# Patient Record
Sex: Female | Born: 1955 | Race: White | Hispanic: No | Marital: Married | Smoking: Never smoker
Health system: Southern US, Community
[De-identification: ages and names within clinical notes are randomized; demographics above are authoritative.]

## PROBLEM LIST (undated history)

## (undated) DIAGNOSIS — E785 Hyperlipidemia, unspecified: Secondary | ICD-10-CM

## (undated) HISTORY — DX: Hyperlipidemia, unspecified: E78.5

## (undated) HISTORY — PX: KNEE ARTHROSCOPY: SUR90

---

## 2004-05-28 ENCOUNTER — Encounter: Payer: Self-pay | Admitting: Family Medicine

## 2004-05-28 LAB — CONVERTED CEMR LAB

## 2004-07-27 ENCOUNTER — Ambulatory Visit: Payer: Self-pay

## 2004-08-17 ENCOUNTER — Ambulatory Visit: Payer: Self-pay | Admitting: Family Medicine

## 2005-05-28 LAB — CONVERTED CEMR LAB: Pap Smear: NORMAL

## 2005-12-05 ENCOUNTER — Ambulatory Visit: Payer: Self-pay | Admitting: Family Medicine

## 2007-01-09 ENCOUNTER — Telehealth (INDEPENDENT_AMBULATORY_CARE_PROVIDER_SITE_OTHER): Payer: Self-pay | Admitting: *Deleted

## 2007-03-22 ENCOUNTER — Encounter: Payer: Self-pay | Admitting: Family Medicine

## 2007-03-22 DIAGNOSIS — E785 Hyperlipidemia, unspecified: Secondary | ICD-10-CM

## 2007-03-29 ENCOUNTER — Encounter: Payer: Self-pay | Admitting: Family Medicine

## 2007-08-01 ENCOUNTER — Telehealth (INDEPENDENT_AMBULATORY_CARE_PROVIDER_SITE_OTHER): Payer: Self-pay | Admitting: *Deleted

## 2007-10-17 ENCOUNTER — Encounter: Payer: Self-pay | Admitting: Family Medicine

## 2007-10-22 ENCOUNTER — Ambulatory Visit: Payer: Self-pay | Admitting: Family Medicine

## 2007-11-22 ENCOUNTER — Encounter: Payer: Self-pay | Admitting: Family Medicine

## 2007-12-19 ENCOUNTER — Encounter: Payer: Self-pay | Admitting: Family Medicine

## 2007-12-24 ENCOUNTER — Encounter: Payer: Self-pay | Admitting: Family Medicine

## 2007-12-25 ENCOUNTER — Encounter (INDEPENDENT_AMBULATORY_CARE_PROVIDER_SITE_OTHER): Payer: Self-pay | Admitting: *Deleted

## 2008-08-28 HISTORY — PX: NOVASURE ABLATION: SHX5394

## 2008-10-06 ENCOUNTER — Ambulatory Visit: Payer: Self-pay | Admitting: Family Medicine

## 2008-10-06 LAB — CONVERTED CEMR LAB
Bilirubin Urine: NEGATIVE
Ketones, urine, test strip: NEGATIVE
RBC / HPF: 0
Specific Gravity, Urine: <1.005
Urobilinogen, UA: 0.2
pH: 7

## 2008-10-07 ENCOUNTER — Encounter (INDEPENDENT_AMBULATORY_CARE_PROVIDER_SITE_OTHER): Payer: Self-pay | Admitting: Internal Medicine

## 2008-11-14 ENCOUNTER — Encounter: Payer: Self-pay | Admitting: Family Medicine

## 2008-11-16 ENCOUNTER — Other Ambulatory Visit: Admission: RE | Admit: 2008-11-16 | Discharge: 2008-11-16 | Payer: Self-pay | Admitting: Family Medicine

## 2008-11-16 ENCOUNTER — Encounter: Payer: Self-pay | Admitting: Family Medicine

## 2008-11-16 ENCOUNTER — Ambulatory Visit: Payer: Self-pay | Admitting: Family Medicine

## 2008-11-19 ENCOUNTER — Encounter (INDEPENDENT_AMBULATORY_CARE_PROVIDER_SITE_OTHER): Payer: Self-pay | Admitting: *Deleted

## 2008-12-21 ENCOUNTER — Encounter: Payer: Self-pay | Admitting: Family Medicine

## 2008-12-28 ENCOUNTER — Encounter (INDEPENDENT_AMBULATORY_CARE_PROVIDER_SITE_OTHER): Payer: Self-pay | Admitting: *Deleted

## 2009-01-20 ENCOUNTER — Telehealth: Payer: Self-pay | Admitting: Family Medicine

## 2009-02-04 ENCOUNTER — Encounter: Payer: Self-pay | Admitting: Family Medicine

## 2009-02-10 ENCOUNTER — Telehealth: Payer: Self-pay | Admitting: Family Medicine

## 2009-02-23 ENCOUNTER — Encounter: Payer: Self-pay | Admitting: Family Medicine

## 2009-04-23 ENCOUNTER — Encounter: Payer: Self-pay | Admitting: Family Medicine

## 2009-12-24 LAB — BASIC METABOLIC PANEL: BUN: 15 mg/dL (ref 4–21)

## 2009-12-24 LAB — CBC AND DIFFERENTIAL
HCT: 36 % (ref 36–46)
Hemoglobin: 12.3 g/dL (ref 12.0–16.0)

## 2010-06-13 ENCOUNTER — Ambulatory Visit: Payer: Self-pay | Admitting: Unknown Physician Specialty

## 2010-06-14 LAB — PATHOLOGY REPORT

## 2010-10-21 LAB — BASIC METABOLIC PANEL
BUN: 14 mg/dL (ref 4–21)
Glucose: 90 mg/dL

## 2011-10-13 ENCOUNTER — Telehealth: Payer: Self-pay | Admitting: Internal Medicine

## 2011-10-13 NOTE — Telephone Encounter (Signed)
Pls advise.  

## 2011-10-13 NOTE — Telephone Encounter (Signed)
Pt aware.

## 2011-10-13 NOTE — Telephone Encounter (Signed)
She can pick up orders for labwork and for mammogram at our office prior to appointment.

## 2011-10-18 ENCOUNTER — Telehealth: Payer: Self-pay | Admitting: *Deleted

## 2011-10-18 DIAGNOSIS — Z1231 Encounter for screening mammogram for malignant neoplasm of breast: Secondary | ICD-10-CM

## 2011-10-18 DIAGNOSIS — E785 Hyperlipidemia, unspecified: Secondary | ICD-10-CM

## 2011-10-18 DIAGNOSIS — Z79899 Other long term (current) drug therapy: Secondary | ICD-10-CM

## 2011-10-18 NOTE — Telephone Encounter (Signed)
Labs ordered, Written order completed for pt to pick up, pt is aware

## 2011-10-18 NOTE — Telephone Encounter (Signed)
See last phone note, What labs? Please order so pt can go to [facility] then we will print the order for pt to pick up.

## 2011-10-25 ENCOUNTER — Telehealth: Payer: Self-pay | Admitting: Internal Medicine

## 2011-10-26 ENCOUNTER — Encounter: Payer: Self-pay | Admitting: Internal Medicine

## 2011-10-26 ENCOUNTER — Ambulatory Visit (INDEPENDENT_AMBULATORY_CARE_PROVIDER_SITE_OTHER): Payer: 59 | Admitting: Internal Medicine

## 2011-10-26 DIAGNOSIS — E785 Hyperlipidemia, unspecified: Secondary | ICD-10-CM

## 2011-10-26 DIAGNOSIS — R739 Hyperglycemia, unspecified: Secondary | ICD-10-CM

## 2011-10-26 DIAGNOSIS — Z803 Family history of malignant neoplasm of breast: Secondary | ICD-10-CM | POA: Insufficient documentation

## 2011-10-26 DIAGNOSIS — Z0000 Encounter for general adult medical examination without abnormal findings: Secondary | ICD-10-CM | POA: Insufficient documentation

## 2011-10-26 DIAGNOSIS — R7309 Other abnormal glucose: Secondary | ICD-10-CM

## 2011-10-26 DIAGNOSIS — Z23 Encounter for immunization: Secondary | ICD-10-CM

## 2011-10-26 MED ORDER — ZOSTER VACCINE LIVE 19400 UNT/0.65ML ~~LOC~~ SOLR: 0.6500 mL | Freq: Once | SUBCUTANEOUS | Status: AC

## 2011-10-26 MED ORDER — ZOSTER VACCINE LIVE 19400 UNT/0.65ML ~~LOC~~ SOLR: 0.6500 mL | Freq: Once | SUBCUTANEOUS | Status: DC

## 2011-10-26 MED ORDER — SIMVASTATIN 20 MG PO TABS: 20.0000 mg | ORAL_TABLET | Freq: Every evening | ORAL | Status: DC

## 2011-10-26 NOTE — Assessment & Plan Note (Signed)
Noted to have hemoglobin A1c of 6% on recent screen by her employer. Recent fasting blood sugar was normal. Will plan to repeat hemoglobin A1c with next labs. Encouraged healthy diet including reduction in processed carbohydrates and increased intake of lean protein and fiber.

## 2011-10-26 NOTE — Assessment & Plan Note (Signed)
Patient's mother was recently diagnosed with breast cancer. Patient also has several gradients who had breast cancer. She is interested in talking to a Dentist about testing for [facility] mutations. We'll set this up for her. Her recent mammogram was normal.  Breast exam is normal today.

## 2011-10-26 NOTE — Progress Notes (Signed)
Subjective:    Patient ID: Sylvia [name], female    DOB: 06-Aug-1956, 56 y.o.   MRN: [id number]  HPI 56 year old female presents for annual exam. She reports that she is generally doing well. She brings record of recent screening from her employer which showed her hemoglobin A1c to be elevated at 6%. She has no history of diabetes. She has no family history of diabetes. She reports that she generally follows a healthy diet and exercises regularly.  We also reviewed labs today from our office showing normal cholesterol levels, kidney and liver function, and blood counts.  Patient notes that over the last year her mother was diagnosed with breast cancer. Her mother has also reported to her that she has several great aunts who have breast cancer. The patient is interested in having testing for her genetic risk for breast cancer. She notes that her recent mammogram performed last month was normal.  Outpatient Encounter Prescriptions as of 10/26/2011  Medication Sig Dispense Refill  . cholecalciferol (VITAMIN D) 1000 UNITS tablet Take 1,000 Units by mouth daily.      . fish oil-omega-3 fatty acids 1000 MG capsule Take 2 g by mouth daily.      . Multiple Vitamins-Minerals (MULTIVITAMIN WITH MINERALS) tablet Take 1 tablet by mouth daily.      . simvastatin (ZOCOR) 20 MG tablet Take 1 tablet (20 mg total) by mouth every evening.  90 tablet  4    Review of Systems  Constitutional: Negative for fever, chills, appetite change, fatigue and unexpected weight change.  HENT: Negative for ear pain, congestion, sore throat, trouble swallowing, neck pain, voice change and sinus pressure.   Eyes: Negative for visual disturbance.  Respiratory: Negative for cough, shortness of breath, wheezing and stridor.   Cardiovascular: Negative for chest pain, palpitations and leg swelling.  Gastrointestinal: Negative for nausea, vomiting, abdominal pain, diarrhea, constipation, blood in stool, abdominal distention and anal  bleeding.  Genitourinary: Negative for dysuria and flank pain.  Musculoskeletal: Negative for myalgias, arthralgias and gait problem.  Skin: Negative for color change and rash.  Neurological: Negative for dizziness and headaches.  Hematological: Negative for adenopathy. Does not bruise/bleed easily.  Psychiatric/Behavioral: Negative for suicidal ideas, sleep disturbance and dysphoric mood. The patient is not nervous/anxious.    BP 98/60  Pulse 91  Temp(Src) 98 F (36.7 C) (Oral)  Ht 5' 3.5" (1.613 m)  Wt 145 lb (65.772 kg)  BMI 25.28 kg/m2  SpO2 100%     Objective:   Physical Exam  Constitutional: She is oriented to person, place, and time. She appears well-developed and well-nourished. No distress.  HENT:  Head: Normocephalic and atraumatic.  Right Ear: External ear normal.  Left Ear: External ear normal.  Nose: Nose normal.  Mouth/Throat: Oropharynx is clear and moist. No oropharyngeal exudate.  Eyes: Conjunctivae are normal. Pupils are equal, round, and reactive to light. Right eye exhibits no discharge. Left eye exhibits no discharge. No scleral icterus.  Neck: Normal range of motion. Neck supple. No tracheal deviation present. No thyromegaly present.  Cardiovascular: Normal rate, regular rhythm, normal heart sounds and intact distal pulses.  Exam reveals no gallop and no friction rub.   No murmur heard. Pulmonary/Chest: Effort normal and breath sounds normal. No respiratory distress. She has no wheezes. She has no rales. She exhibits no tenderness. Right breast exhibits no inverted nipple, no mass, no nipple discharge, no skin change and no tenderness. Left breast exhibits no inverted nipple, no mass, no nipple discharge,  no skin change and no tenderness. Breasts are symmetrical.  Abdominal: Soft. Bowel sounds are normal. She exhibits no distension and no mass. There is no tenderness. There is no rebound and no guarding.  Musculoskeletal: Normal range of motion. She exhibits no  edema and no tenderness.  Lymphadenopathy:    She has no cervical adenopathy.  Neurological: She is alert and oriented to person, place, and time. No cranial nerve deficit. She exhibits normal muscle tone. Coordination normal.  Skin: Skin is warm and dry. No rash noted. She is not diaphoretic. No erythema. No pallor.  Psychiatric: She has a normal mood and affect. Her behavior is normal. Judgment and thought content normal.          Assessment & Plan:

## 2011-10-26 NOTE — Assessment & Plan Note (Signed)
Exam including breast exam is normal today. Patient is up-to-date on Pap smear. Plan for repeat Pap in 2015. Patient is up-to-date on health maintenance including mammogram. Vaccinations are up to date with the exception of Zostavax and this was ordered today. Labs including CBC, CMP, lipid profile, and hemoglobin A1c were reviewed today. Followup in 6 months.

## 2011-10-26 NOTE — Assessment & Plan Note (Signed)
Lipid profile reviewed today. Lipids are well controlled. Plan to repeat lipid profile in 6 months.

## 2011-10-26 NOTE — Telephone Encounter (Signed)
Pt has OV today

## 2011-10-30 ENCOUNTER — Encounter: Payer: Self-pay | Admitting: Internal Medicine

## 2011-11-06 ENCOUNTER — Encounter: Payer: Self-pay | Admitting: Internal Medicine

## 2011-11-08 ENCOUNTER — Ambulatory Visit (INDEPENDENT_AMBULATORY_CARE_PROVIDER_SITE_OTHER): Payer: 59 | Admitting: Internal Medicine

## 2011-11-08 ENCOUNTER — Encounter: Payer: Self-pay | Admitting: Internal Medicine

## 2011-11-08 VITALS — BP 118/60 | HR 87 | Temp 98.3°F | Wt 149.0 lb

## 2011-11-08 DIAGNOSIS — J40 Bronchitis, not specified as acute or chronic: Secondary | ICD-10-CM

## 2011-11-08 MED ORDER — LEVOFLOXACIN 750 MG PO TABS: 750.0000 mg | ORAL_TABLET | Freq: Every day | ORAL | Status: AC

## 2011-11-08 NOTE — Progress Notes (Signed)
  Subjective:    Patient ID: Sylvia [name], female    DOB: Jun 02, 1956, 56 y.o.   MRN: [id number]  HPI 56YO female presents for acute visit c/o 3 week h/o sore throat, cough productive of purulent sputum.  Denies dyspnea or chest pain.  Has been taking mucinex with no improvement in symptoms.  Initially had fever, chills, but this has resolved.  Outpatient Encounter Prescriptions as of 11/08/2011  Medication Sig Dispense Refill  . cholecalciferol (VITAMIN D) 1000 UNITS tablet Take 1,000 Units by mouth daily.      . fish oil-omega-3 fatty acids 1000 MG capsule Take 2 g by mouth daily.      . Multiple Vitamins-Minerals (MULTIVITAMIN WITH MINERALS) tablet Take 1 tablet by mouth daily.      . simvastatin (ZOCOR) 20 MG tablet Take 1 tablet (20 mg total) by mouth every evening.  90 tablet  4  . levofloxacin (LEVAQUIN) 750 MG tablet Take 1 tablet (750 mg total) by mouth daily.  7 tablet  0    Review of Systems  Constitutional: Positive for fever, chills and fatigue. Negative for unexpected weight change.  HENT: Positive for congestion and postnasal drip. Negative for hearing loss, ear pain, nosebleeds, sore throat, facial swelling, rhinorrhea, sneezing, mouth sores, trouble swallowing, neck pain, neck stiffness, voice change, sinus pressure, tinnitus and ear discharge.   Eyes: Negative for pain, discharge, redness and visual disturbance.  Respiratory: Positive for cough. Negative for chest tightness, shortness of breath, wheezing and stridor.   Cardiovascular: Negative for chest pain, palpitations and leg swelling.  Musculoskeletal: Negative for myalgias and arthralgias.  Skin: Negative for color change and rash.  Neurological: Negative for dizziness, weakness, light-headedness and headaches.  Hematological: Negative for adenopathy.   BP 118/60  Pulse 87  Temp(Src) 98.3 F (36.8 C) (Oral)  Wt 149 lb (67.586 kg)  SpO2 100%     Objective:   Physical Exam  Constitutional: She is oriented to  person, place, and time. She appears well-developed and well-nourished. No distress.  HENT:  Head: Normocephalic and atraumatic.  Right Ear: External ear normal.  Left Ear: External ear normal.  Nose: Nose normal.  Mouth/Throat: Oropharynx is clear and moist. No oropharyngeal exudate.  Eyes: Conjunctivae are normal. Pupils are equal, round, and reactive to light. Right eye exhibits no discharge. Left eye exhibits no discharge. No scleral icterus.  Neck: Normal range of motion. Neck supple. No tracheal deviation present. No thyromegaly present.  Cardiovascular: Normal rate, regular rhythm, normal heart sounds and intact distal pulses.  Exam reveals no gallop and no friction rub.   No murmur heard. Pulmonary/Chest: Effort normal. No accessory muscle usage. Not tachypneic. No respiratory distress. She has no wheezes. She has rhonchi (diffuse). She has no rales. She exhibits no tenderness.  Musculoskeletal: Normal range of motion. She exhibits no edema and no tenderness.  Lymphadenopathy:    She has no cervical adenopathy.  Neurological: She is alert and oriented to person, place, and time. No cranial nerve deficit. She exhibits normal muscle tone. Coordination normal.  Skin: Skin is warm and dry. No rash noted. She is not diaphoretic. No erythema. No pallor.  Psychiatric: She has a normal mood and affect. Her behavior is normal. Judgment and thought content normal.          Assessment & Plan:

## 2011-11-08 NOTE — Assessment & Plan Note (Signed)
Symptoms and exam consistent with bronchitis. Will treat with Levaquin. No wheezing or prolonged expiration on exam to suggest need for steroids or bronchodilator. Pt will call if symptoms not improving over next 48hr.

## 2012-04-18 ENCOUNTER — Telehealth: Payer: Self-pay | Admitting: Internal Medicine

## 2012-04-18 NOTE — Telephone Encounter (Signed)
Caller: Yomira/Patient; Patient Name: Sylvia [name]; PCP: Ronna Polio; Best Callback Phone Number: [phone]. Patient has left knee pain. She was calling to ask if the office has x-ray capabilities. Informed patient that we do not do x-rays in the office but prefer that she be seen before being referred to a clinic. Patient prefers to be seen in an urgent care where they can do x-rays at that time. Offered to triage the patient for need to be seen in office, but she refused at this time.

## 2012-05-14 ENCOUNTER — Ambulatory Visit: Payer: Self-pay | Admitting: Unknown Physician Specialty

## 2012-06-14 ENCOUNTER — Ambulatory Visit: Payer: Self-pay | Admitting: Unknown Physician Specialty

## 2012-09-12 ENCOUNTER — Telehealth: Payer: Self-pay | Admitting: Internal Medicine

## 2012-09-12 NOTE — Telephone Encounter (Signed)
Left message on machine at home advising patient that [facility] order form is ready for pick up will be left at front desk.

## 2012-09-12 NOTE — Telephone Encounter (Signed)
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

## 2012-09-12 NOTE — Telephone Encounter (Signed)
We can fill out sheet for her to pick up. On your desk.

## 2012-11-15 ENCOUNTER — Encounter: Payer: 59 | Admitting: Internal Medicine

## 2012-12-13 ENCOUNTER — Other Ambulatory Visit: Payer: Self-pay | Admitting: Internal Medicine

## 2012-12-14 LAB — HGB A1C W/O EAG: Hgb A1c MFr Bld: 5.6 % (ref 4.8–5.6)

## 2012-12-14 LAB — COMPREHENSIVE METABOLIC PANEL
ALT: 12 IU/L (ref 0–32)
AST: 15 IU/L (ref 0–40)
Albumin/Globulin Ratio: 2.1 (ref 1.1–2.5)
Alkaline Phosphatase: 82 IU/L (ref 39–117)
BUN/Creatinine Ratio: 19 (ref 9–23)
Calcium: 9.3 mg/dL (ref 8.7–10.2)
Creatinine, Ser: 0.74 mg/dL (ref 0.57–1.00)
GFR calc non Af Amer: 91 mL/min/{1.73_m2} (ref >59)
Globulin, Total: 2.1 g/dL (ref 1.5–4.5)
Potassium: 4.2 mmol/L (ref 3.5–5.2)
Sodium: 142 mmol/L (ref 134–144)

## 2012-12-14 LAB — CBC WITH DIFFERENTIAL
Basos: 1 % (ref 0–3)
Eosinophils Absolute: 0.1 10*3/uL (ref 0.0–0.4)
Immature Grans (Abs): 0 10*3/uL (ref 0.0–0.1)
Immature Granulocytes: 0 % (ref 0–2)
Lymphocytes Absolute: 1.6 10*3/uL (ref 0.7–3.1)
MCH: 30.3 pg (ref 26.6–33.0)
MCHC: 33.3 g/dL (ref 31.5–35.7)
MCV: 91 fL (ref 79–97)
Monocytes Absolute: 0.3 10*3/uL (ref 0.1–0.9)
Neutrophils Relative %: 50 % (ref 40–74)
Platelets: 231 10*3/uL (ref 155–379)

## 2012-12-14 LAB — LIPID PANEL W/O CHOL/HDL RATIO: HDL: 68 mg/dL (ref >39)

## 2012-12-14 LAB — TSH: TSH: 1.64 u[IU]/mL (ref 0.450–4.500)

## 2012-12-14 LAB — VITAMIN D 25 HYDROXY (VIT D DEFICIENCY, FRACTURES): Vit D, 25-Hydroxy: 30.1 ng/mL (ref 30.0–100.0)

## 2012-12-25 ENCOUNTER — Encounter: Payer: Self-pay | Admitting: Internal Medicine

## 2012-12-25 ENCOUNTER — Other Ambulatory Visit (HOSPITAL_COMMUNITY)
Admission: RE | Admit: 2012-12-25 | Discharge: 2012-12-25 | Disposition: A | Discharge status: Home / Self Care | Payer: 59 | Source: Ambulatory Visit | Attending: Internal Medicine | Admitting: Internal Medicine

## 2012-12-25 ENCOUNTER — Ambulatory Visit (INDEPENDENT_AMBULATORY_CARE_PROVIDER_SITE_OTHER): Payer: 59 | Admitting: Internal Medicine

## 2012-12-25 VITALS — BP 124/72 | HR 84 | Temp 98.2°F | Ht 63.5 in | Wt 143.0 lb

## 2012-12-25 DIAGNOSIS — Z0000 Encounter for general adult medical examination without abnormal findings: Secondary | ICD-10-CM

## 2012-12-25 DIAGNOSIS — E785 Hyperlipidemia, unspecified: Secondary | ICD-10-CM

## 2012-12-25 DIAGNOSIS — Z01419 Encounter for gynecological examination (general) (routine) without abnormal findings: Secondary | ICD-10-CM | POA: Insufficient documentation

## 2012-12-25 DIAGNOSIS — Z1151 Encounter for screening for human papillomavirus (HPV): Secondary | ICD-10-CM | POA: Insufficient documentation

## 2012-12-25 LAB — HM PAP SMEAR: HM Pap smear: NEGATIVE

## 2012-12-25 MED ORDER — SIMVASTATIN 20 MG PO TABS: 20.0000 mg | ORAL_TABLET | Freq: Every evening | ORAL | Status: DC

## 2012-12-25 MED ORDER — ZOSTER VACCINE LIVE 19400 UNT/0.65ML ~~LOC~~ SOLR: 0.6500 mL | Freq: Once | SUBCUTANEOUS | Status: DC

## 2012-12-25 NOTE — Progress Notes (Signed)
Subjective:    Patient ID: Sylvia [name], female    DOB: 02-26-1956, 58 y.o.   MRN: [id number]  HPI 57YO female presents for annual exam. Doing well. No concerns today. Follows healthy diet and gets regular physical activity. Compliant with simvastatin. No noted side effects.  Outpatient Encounter Prescriptions as of 12/25/2012  Medication Sig Dispense Refill  . cholecalciferol (VITAMIN D) 1000 UNITS tablet Take 1,000 Units by mouth daily.      . fish oil-omega-3 fatty acids 1000 MG capsule Take 2 g by mouth daily.      . simvastatin (ZOCOR) 20 MG tablet Take 1 tablet (20 mg total) by mouth every evening.  90 tablet  4   No facility-administered encounter medications on file as of 12/25/2012.   BP 124/72  Pulse 84  Temp(Src) 98.2 F (36.8 C) (Oral)  Ht 5' 3.5" (1.613 m)  Wt 143 lb (64.864 kg)  BMI 24.93 kg/m2  SpO2 98%  Review of Systems  Constitutional: Negative for fever, chills, appetite change, fatigue and unexpected weight change.  HENT: Negative for ear pain, congestion, sore throat, trouble swallowing, neck pain, voice change and sinus pressure.   Eyes: Negative for visual disturbance.  Respiratory: Negative for cough, shortness of breath, wheezing and stridor.   Cardiovascular: Negative for chest pain, palpitations and leg swelling.  Gastrointestinal: Negative for nausea, vomiting, abdominal pain, diarrhea, constipation, blood in stool, abdominal distention and anal bleeding.  Genitourinary: Negative for dysuria and flank pain.  Musculoskeletal: Negative for myalgias, arthralgias and gait problem.  Skin: Negative for color change and rash.  Neurological: Negative for dizziness and headaches.  Hematological: Negative for adenopathy. Does not bruise/bleed easily.  Psychiatric/Behavioral: Negative for suicidal ideas, sleep disturbance and dysphoric mood. The patient is not nervous/anxious.        Objective:   Physical Exam  Constitutional: She is oriented to person,  place, and time. She appears well-developed and well-nourished. No distress.  HENT:  Head: Normocephalic and atraumatic.  Right Ear: External ear normal.  Left Ear: External ear normal.  Nose: Nose normal.  Mouth/Throat: Oropharynx is clear and moist. No oropharyngeal exudate.  Eyes: Conjunctivae are normal. Pupils are equal, round, and reactive to light. Right eye exhibits no discharge. Left eye exhibits no discharge. No scleral icterus.  Neck: Normal range of motion. Neck supple. No tracheal deviation present. No thyromegaly present.  Cardiovascular: Normal rate, regular rhythm, normal heart sounds and intact distal pulses.  Exam reveals no gallop and no friction rub.   No murmur heard. Pulmonary/Chest: Effort normal and breath sounds normal. No respiratory distress. She has no wheezes. She has no rales. She exhibits no tenderness.  Abdominal: Soft. Bowel sounds are normal. She exhibits no distension and no mass. There is no tenderness. There is no rebound and no guarding.  Genitourinary: Rectum normal, vagina normal and uterus normal. No breast swelling, tenderness, discharge or bleeding. Pelvic exam was performed with patient supine. There is no rash, tenderness or lesion on the right labia. There is no rash, tenderness or lesion on the left labia. Uterus is not enlarged and not tender. Cervix exhibits no motion tenderness, no discharge and no friability. Right adnexum displays no mass, no tenderness and no fullness. Left adnexum displays no mass, no tenderness and no fullness. No erythema or tenderness around the vagina. No vaginal discharge found.  Musculoskeletal: Normal range of motion. She exhibits no edema and no tenderness.  Lymphadenopathy:    She has no cervical adenopathy.  Neurological: She  is alert and oriented to person, place, and time. No cranial nerve deficit. She exhibits normal muscle tone. Coordination normal.  Skin: Skin is warm and dry. No rash noted. She is not  diaphoretic. No erythema. No pallor.  Psychiatric: She has a normal mood and affect. Her behavior is normal. Judgment and thought content normal.          Assessment & Plan:

## 2012-12-25 NOTE — Assessment & Plan Note (Signed)
General medical exam including breast and pelvic exam normal today. PAP is pending. Mammogram ordered. Reviewed recent labs which showed normal cholesterol, liver and kidney function. Encouraged continued efforts at healthy diet and regular physical activity.

## 2012-12-25 NOTE — Assessment & Plan Note (Signed)
Discussed new cholesterol guidelines. Calculated 10 year risk ASCVD 1.4%. Doing well on simvastatin. Will continue.

## 2013-02-11 LAB — HM MAMMOGRAPHY: HM MAMMO: NORMAL

## 2013-06-11 ENCOUNTER — Telehealth: Payer: Self-pay | Admitting: *Deleted

## 2013-06-11 NOTE — Telephone Encounter (Signed)
Patient left a voicemail stating she received her shingles vaccine on Saturday. However on the information pamphlet it state exposure with pregnant women, small children and people with a compromised immune system should be limited. She would like to know how long she should be avoid those ppl? She called and asked the pharmacist and he told her to contact her PCP

## 2013-06-11 NOTE — Telephone Encounter (Signed)
Patient informed, read instructions to patient and she verbalized understanding.

## 2013-06-11 NOTE — Telephone Encounter (Signed)
Here are the guidelines.   Transmission of virus: Although transmission of the vaccine virus may occur between vaccinees and susceptible contacts, vaccinated individuals do not need to take precautions against spreading varicella following vaccination; transmission of virus is rare following vaccination unless rash develops. In case of rash, standard contact precautions should be followed. Persons with rash should avoid contact with persons at high risk for severe varicella infection until lesions have crusted (CDC/ACIP [Harpaz, 2008])

## 2013-08-28 HISTORY — PX: CYST EXCISION: SHX5701

## 2013-12-25 ENCOUNTER — Telehealth: Payer: Self-pay | Admitting: *Deleted

## 2013-12-25 NOTE — Telephone Encounter (Signed)
Pt has appointment scheduled in May. Would like to have labs done at [facility] prior to that appointment, needs order placed for [facility] (pt uses [place] location). Pt requested call back that this could be done. Spoke with pt, advised Dr. Dan HumphreysWalker was out of the office until Monday but that labs would be ordered early next week, pt verbalized understanding.   Dr. Dan HumphreysWalker- Are you able to enter labs viz Epic for [facility] or will she need written order?

## 2013-12-25 NOTE — Telephone Encounter (Signed)
She will need a written order from [facility]. I will fill out on Monday and we can fax over.

## 2013-12-25 NOTE — Telephone Encounter (Signed)
[facility] order placed in folder

## 2013-12-29 ENCOUNTER — Other Ambulatory Visit: Payer: Self-pay | Admitting: Internal Medicine

## 2013-12-29 NOTE — Telephone Encounter (Signed)
Appt scheduled

## 2014-01-12 ENCOUNTER — Telehealth: Payer: Self-pay | Admitting: Internal Medicine

## 2014-01-12 ENCOUNTER — Encounter: Payer: Self-pay | Admitting: *Deleted

## 2014-01-12 LAB — LIPID PANEL
Cholesterol: 149 mg/dL (ref 0–200)
HDL: 74 mg/dL — AB (ref 35–70)
LDL Cholesterol: 63 mg/dL

## 2014-01-12 NOTE — Telephone Encounter (Signed)
Labs from 5/16 showed normal blood counts, normal kidney and liver function, normal cholesterol with LDL 149, HDL 74 and LDL of 63. Thyroid function was normal. Vit D was normal.

## 2014-01-12 NOTE — Telephone Encounter (Signed)
Advised pts husband of lab results.

## 2014-01-14 ENCOUNTER — Ambulatory Visit (INDEPENDENT_AMBULATORY_CARE_PROVIDER_SITE_OTHER): Payer: 59 | Admitting: Internal Medicine

## 2014-01-14 ENCOUNTER — Encounter (INDEPENDENT_AMBULATORY_CARE_PROVIDER_SITE_OTHER): Payer: Self-pay

## 2014-01-14 ENCOUNTER — Encounter: Payer: Self-pay | Admitting: Internal Medicine

## 2014-01-14 ENCOUNTER — Encounter: Payer: 59 | Admitting: Internal Medicine

## 2014-01-14 VITALS — BP 110/70 | HR 89 | Temp 98.3°F | Ht 63.5 in | Wt 138.5 lb

## 2014-01-14 DIAGNOSIS — Z0000 Encounter for general adult medical examination without abnormal findings: Secondary | ICD-10-CM

## 2014-01-14 DIAGNOSIS — E785 Hyperlipidemia, unspecified: Secondary | ICD-10-CM

## 2014-01-14 MED ORDER — SIMVASTATIN 20 MG PO TABS: ORAL_TABLET | ORAL | Status: DC

## 2014-01-14 NOTE — Progress Notes (Signed)
Subjective:    Patient ID: Sylvia [name], female    DOB: 1956-03-14, 58 y.o.   MRN: [id number]  HPI 57YO female presents for annual exam. Doing well. Following healthy diet. Exercising by biking. No concerns today.  Review of Systems  Constitutional: Negative for fever, chills, appetite change, fatigue and unexpected weight change.  HENT: Negative for congestion, ear pain, sinus pressure, sore throat, trouble swallowing and voice change.   Eyes: Negative for visual disturbance.  Respiratory: Negative for cough, shortness of breath, wheezing and stridor.   Cardiovascular: Negative for chest pain, palpitations and leg swelling.  Gastrointestinal: Negative for nausea, vomiting, abdominal pain, diarrhea, constipation, blood in stool, abdominal distention and anal bleeding.  Genitourinary: Negative for dysuria and flank pain.  Musculoskeletal: Negative for arthralgias, gait problem, myalgias and neck pain.  Skin: Negative for color change and rash.  Neurological: Negative for dizziness and headaches.  Hematological: Negative for adenopathy. Does not bruise/bleed easily.  Psychiatric/Behavioral: Negative for suicidal ideas, sleep disturbance and dysphoric mood. The patient is not nervous/anxious.        Objective:    BP 110/70  Pulse 89  Temp(Src) 98.3 F (36.8 C) (Oral)  Ht 5' 3.5" (1.613 m)  Wt 138 lb 8 oz (62.823 kg)  BMI 24.15 kg/m2  SpO2 97% Physical Exam  Constitutional: She is oriented to person, place, and time. She appears well-developed and well-nourished. No distress.  HENT:  Head: Normocephalic and atraumatic.  Right Ear: External ear normal.  Left Ear: External ear normal.  Nose: Nose normal.  Mouth/Throat: Oropharynx is clear and moist. No oropharyngeal exudate.  Eyes: Conjunctivae are normal. Pupils are equal, round, and reactive to light. Right eye exhibits no discharge. Left eye exhibits no discharge. No scleral icterus.  Neck: Normal range of motion. Neck  supple. No tracheal deviation present. No thyromegaly present.  Cardiovascular: Normal rate, regular rhythm, normal heart sounds and intact distal pulses.  Exam reveals no gallop and no friction rub.   No murmur heard. Pulmonary/Chest: Effort normal and breath sounds normal. No accessory muscle usage. Not tachypneic. No respiratory distress. She has no decreased breath sounds. She has no wheezes. She has no rales. She exhibits no tenderness. Right breast exhibits no inverted nipple, no mass, no nipple discharge, no skin change and no tenderness. Left breast exhibits no inverted nipple, no mass, no nipple discharge, no skin change and no tenderness. Breasts are symmetrical.  Abdominal: Soft. Bowel sounds are normal. She exhibits no distension and no mass. There is no tenderness. There is no rebound and no guarding.  Musculoskeletal: Normal range of motion. She exhibits no edema and no tenderness.  Lymphadenopathy:    She has no cervical adenopathy.  Neurological: She is alert and oriented to person, place, and time. No cranial nerve deficit. She exhibits normal muscle tone. Coordination normal.  Skin: Skin is warm and dry. No rash noted. She is not diaphoretic. No erythema. No pallor.  Psychiatric: She has a normal mood and affect. Her behavior is normal. Judgment and thought content normal.          Assessment & Plan:   Problem List Items Addressed This Visit     Unprioritized   Hyperlipidemia LDL goal < 100     Reviewed recent labs with TC 149, LDL 63, HDL 74. Encouraged Mediterranean style diet and exercise with goal of 40min 3 x per week. Continue Simvastatin. Pt will have LFTs rechecked at work later this year.    Relevant Medications  simvastatin (ZOCOR) tablet   Routine general medical examination at a health care facility - Primary     General medical exam normal today including breast exam. PAP and pelvic deferred given PAP normal HPV neg in 2014. Mammogram ordered.  Colonoscopy UTD. Immunizations UTD. Encouraged healthy diet and exercise. Reviewed recent labs. Follow up 1 year.    Relevant Orders      MM Digital Screening       Return in about 1 year (around 01/15/2015) for Physical.

## 2014-01-14 NOTE — Progress Notes (Signed)
Pre visit review using our clinic review tool, if applicable. No additional management support is needed unless otherwise documented below in the visit note. 

## 2014-01-14 NOTE — Assessment & Plan Note (Signed)
Reviewed recent labs with TC 149, LDL 63, HDL 74. Encouraged Mediterranean style diet and exercise with goal of 3 x per week. Continue Simvastatin. Pt will have LFTs rechecked at work later this year.

## 2014-01-14 NOTE — Assessment & Plan Note (Signed)
General medical exam normal today including breast exam. PAP and pelvic deferred given PAP normal HPV neg in 2014. Mammogram ordered. Colonoscopy UTD. Immunizations UTD. Encouraged healthy diet and exercise. Reviewed recent labs. Follow up 1 year.

## 2014-01-28 ENCOUNTER — Encounter: Payer: Self-pay | Admitting: Internal Medicine

## 2014-02-05 ENCOUNTER — Encounter: Payer: Self-pay | Admitting: Internal Medicine

## 2014-02-16 LAB — HM MAMMOGRAPHY: HM MAMMO: NEGATIVE

## 2014-02-17 ENCOUNTER — Encounter: Payer: Self-pay | Admitting: *Deleted

## 2014-05-15 ENCOUNTER — Ambulatory Visit (INDEPENDENT_AMBULATORY_CARE_PROVIDER_SITE_OTHER): Payer: 59 | Admitting: Internal Medicine

## 2014-05-15 ENCOUNTER — Encounter: Payer: Self-pay | Admitting: Internal Medicine

## 2014-05-15 VITALS — BP 112/70 | HR 82 | Temp 98.2°F | Ht 63.5 in | Wt 126.8 lb

## 2014-05-15 DIAGNOSIS — Z23 Encounter for immunization: Secondary | ICD-10-CM

## 2014-05-15 DIAGNOSIS — R7309 Other abnormal glucose: Secondary | ICD-10-CM

## 2014-05-15 DIAGNOSIS — R739 Hyperglycemia, unspecified: Secondary | ICD-10-CM | POA: Insufficient documentation

## 2014-05-15 DIAGNOSIS — E785 Hyperlipidemia, unspecified: Secondary | ICD-10-CM

## 2014-05-15 DIAGNOSIS — D509 Iron deficiency anemia, unspecified: Secondary | ICD-10-CM | POA: Insufficient documentation

## 2014-05-15 NOTE — Progress Notes (Signed)
   Subjective:    Patient ID: Sylvia [name], female    DOB: 1956-01-21, 58 y.o.   MRN: [id number]  HPI 58YO female presents for follow up. Recently had screening at work which showed A1c 6.2%. Day prior had given blood. Questions if anemia may have contributed. No h/o DM. No family h/o DM.   Aside from this, feeling well. No other concerns. Review of Systems  Constitutional: Negative for fever, chills, appetite change, fatigue and unexpected weight change.  Eyes: Negative for visual disturbance.  Respiratory: Negative for shortness of breath.   Cardiovascular: Negative for chest pain and leg swelling.  Gastrointestinal: Negative for abdominal pain.  Skin: Negative for color change and rash.  Hematological: Negative for adenopathy. Does not bruise/bleed easily.  Psychiatric/Behavioral: Negative for dysphoric mood. The patient is not nervous/anxious.        Objective:    BP 112/70  Pulse 82  Temp(Src) 98.2 F (36.8 C) (Oral)  Ht 5' 3.5" (1.613 m)  Wt 126 lb 12 oz (57.493 kg)  BMI 22.10 kg/m2  SpO2 96% Physical Exam  Constitutional: She is oriented to person, place, and time. She appears well-developed and well-nourished. No distress.  HENT:  Head: Normocephalic and atraumatic.  Right Ear: External ear normal.  Left Ear: External ear normal.  Nose: Nose normal.  Mouth/Throat: Oropharynx is clear and moist.  Eyes: Conjunctivae are normal. Pupils are equal, round, and reactive to light. Right eye exhibits no discharge. Left eye exhibits no discharge. No scleral icterus.  Neck: Normal range of motion. Neck supple. No tracheal deviation present. No thyromegaly present.  Cardiovascular: Normal rate, regular rhythm, normal heart sounds and intact distal pulses.  Exam reveals no gallop and no friction rub.   No murmur heard. Pulmonary/Chest: Effort normal and breath sounds normal. No accessory muscle usage. Not tachypneic. No respiratory distress. She has no decreased breath sounds.  She has no wheezes. She has no rhonchi. She has no rales. She exhibits no tenderness.  Musculoskeletal: Normal range of motion. She exhibits no edema and no tenderness.  Lymphadenopathy:    She has no cervical adenopathy.  Neurological: She is alert and oriented to person, place, and time. No cranial nerve deficit. She exhibits normal muscle tone. Coordination normal.  Skin: Skin is warm and dry. No rash noted. She is not diaphoretic. No erythema. No pallor.  Psychiatric: She has a normal mood and affect. Her behavior is normal. Judgment and thought content normal.          Assessment & Plan:   Problem List Items Addressed This Visit     Unprioritized   Elevated blood sugar - Primary     Will check A1c with labs today. Suspect that last reading of 6.2% was falsely elevated after blood donation.    Iron deficiency anemia     Given recent blood donations, will check CBC with labs.    Other and unspecified hyperlipidemia     Will check LFTs with labs today.        Return in about 8 months (around 01/13/2015) for Physical.

## 2014-05-15 NOTE — Assessment & Plan Note (Signed)
Will check LFTs with labs today. 

## 2014-05-15 NOTE — Assessment & Plan Note (Signed)
Will check A1c with labs today. Suspect that last reading of 6.2% was falsely elevated after blood donation.

## 2014-05-15 NOTE — Assessment & Plan Note (Signed)
Given recent blood donations, will check CBC with labs.

## 2014-05-15 NOTE — Patient Instructions (Signed)
Labs today.  Follow up in 6 months. 

## 2014-05-15 NOTE — Progress Notes (Signed)
Pre visit review using our clinic review tool, if applicable. No additional management support is needed unless otherwise documented below in the visit note. 

## 2014-05-25 ENCOUNTER — Telehealth: Payer: Self-pay | Admitting: Internal Medicine

## 2014-05-25 NOTE — Telephone Encounter (Signed)
Labs from 9/24 showed normal CBC, CMP, and A1c

## 2014-06-22 ENCOUNTER — Encounter: Payer: Self-pay | Admitting: Internal Medicine

## 2014-12-23 ENCOUNTER — Encounter: Payer: Self-pay | Admitting: Internal Medicine

## 2015-01-21 ENCOUNTER — Other Ambulatory Visit: Payer: Self-pay | Admitting: Internal Medicine

## 2015-01-22 LAB — HGB A1C W/O EAG: Hgb A1c MFr Bld: 5.8 % — ABNORMAL HIGH (ref 4.8–5.6)

## 2015-01-22 LAB — COMPREHENSIVE METABOLIC PANEL
ALBUMIN: 4.7 g/dL (ref 3.5–5.5)
ALT: 15 IU/L (ref 0–32)
AST: 22 IU/L (ref 0–40)
Albumin/Globulin Ratio: 2 (ref 1.1–2.5)
Alkaline Phosphatase: 81 IU/L (ref 39–117)
BILIRUBIN TOTAL: 0.3 mg/dL (ref 0.0–1.2)
BUN/Creatinine Ratio: 24 — ABNORMAL HIGH (ref 9–23)
BUN: 15 mg/dL (ref 6–24)
CO2: 24 mmol/L (ref 18–29)
CREATININE: 0.63 mg/dL (ref 0.57–1.00)
Calcium: 9.6 mg/dL (ref 8.7–10.2)
Chloride: 101 mmol/L (ref 97–108)
GFR, EST AFRICAN AMERICAN: 114 mL/min/{1.73_m2} (ref >59)
GFR, EST NON AFRICAN AMERICAN: 99 mL/min/{1.73_m2} (ref >59)
Globulin, Total: 2.3 g/dL (ref 1.5–4.5)
Glucose: 86 mg/dL (ref 65–99)
Potassium: 4.4 mmol/L (ref 3.5–5.2)
Sodium: 141 mmol/L (ref 134–144)
Total Protein: 7 g/dL (ref 6.0–8.5)

## 2015-01-22 LAB — CBC WITH DIFFERENTIAL/PLATELET
BASOS ABS: 0 10*3/uL (ref 0.0–0.2)
BASOS: 1 %
EOS (ABSOLUTE): 0 10*3/uL (ref 0.0–0.4)
Eos: 1 %
Hematocrit: 39.6 % (ref 34.0–46.6)
Hemoglobin: 13 g/dL (ref 11.1–15.9)
Immature Grans (Abs): 0 10*3/uL (ref 0.0–0.1)
Immature Granulocytes: 0 %
LYMPHS ABS: 1.7 10*3/uL (ref 0.7–3.1)
Lymphs: 44 %
MCH: 29.3 pg (ref 26.6–33.0)
MCHC: 32.8 g/dL (ref 31.5–35.7)
MCV: 89 fL (ref 79–97)
MONOCYTES: 9 %
Monocytes Absolute: 0.3 10*3/uL (ref 0.1–0.9)
NEUTROS PCT: 45 %
Neutrophils Absolute: 1.7 10*3/uL (ref 1.4–7.0)
Platelets: 246 10*3/uL (ref 150–379)
RBC: 4.43 x10E6/uL (ref 3.77–5.28)
RDW: 15.3 % (ref 12.3–15.4)
WBC: 3.8 10*3/uL (ref 3.4–10.8)

## 2015-01-22 LAB — LIPID PANEL W/O CHOL/HDL RATIO
Cholesterol, Total: 167 mg/dL (ref 100–199)
HDL: 91 mg/dL (ref >39)
LDL Calculated: 66 mg/dL (ref 0–99)
Triglycerides: 48 mg/dL (ref 0–149)
VLDL CHOLESTEROL CAL: 10 mg/dL (ref 5–40)

## 2015-01-22 LAB — TSH: TSH: 1.41 u[IU]/mL (ref 0.450–4.500)

## 2015-01-22 LAB — VITAMIN D 25 HYDROXY (VIT D DEFICIENCY, FRACTURES): Vit D, 25-Hydroxy: 43.3 ng/mL (ref 30.0–100.0)

## 2015-01-28 ENCOUNTER — Encounter: Payer: Self-pay | Admitting: Internal Medicine

## 2015-01-28 ENCOUNTER — Ambulatory Visit (INDEPENDENT_AMBULATORY_CARE_PROVIDER_SITE_OTHER): Payer: 59 | Admitting: Internal Medicine

## 2015-01-28 VITALS — BP 99/63 | HR 76 | Temp 98.0°F | Ht 63.5 in | Wt 130.4 lb

## 2015-01-28 DIAGNOSIS — Z0000 Encounter for general adult medical examination without abnormal findings: Secondary | ICD-10-CM

## 2015-01-28 MED ORDER — SIMVASTATIN 20 MG PO TABS: ORAL_TABLET | ORAL | Status: DC

## 2015-01-28 NOTE — Progress Notes (Signed)
Pre visit review using our clinic review tool, if applicable. No additional management support is needed unless otherwise documented below in the visit note. 

## 2015-01-28 NOTE — Assessment & Plan Note (Signed)
General medical exam normal today including breast exam. PAP and pelvic deferred as PAP normal, HPV neg in 2014. Mammogram ordered. Immunizations UTD. Labs reviewed. Encouraged healthy diet and exercise.

## 2015-01-28 NOTE — Progress Notes (Signed)
   Subjective:    Patient ID: Sylvia [name], female    DOB: [birth year], 59 y.o.   MRN: [id number]  HPI 59YO female presents for annual exam.  Feeling well. No concerns today. Compliant with medication.   Past medical, surgical, family and social history per today's encounter.  Review of Systems  Constitutional: Negative for fever, chills, appetite change, fatigue and unexpected weight change.  Eyes: Negative for visual disturbance.  Respiratory: Negative for shortness of breath.   Cardiovascular: Negative for chest pain and leg swelling.  Gastrointestinal: Negative for vomiting, abdominal pain, diarrhea and constipation.  Musculoskeletal: Negative for myalgias and arthralgias.  Skin: Negative for color change and rash.  Hematological: Negative for adenopathy. Does not bruise/bleed easily.  Psychiatric/Behavioral: Negative for suicidal ideas, sleep disturbance and dysphoric mood. The patient is not nervous/anxious.        Objective:    BP 99/63 mmHg  Pulse 76  Temp(Src) 98 F (36.7 C) (Oral)  Ht 5' 3.5" (1.613 m)  Wt 130 lb 6 oz (59.138 kg)  BMI 22.73 kg/m2  SpO2 99% Physical Exam  Constitutional: She is oriented to person, place, and time. She appears well-developed and well-nourished. No distress.  HENT:  Head: Normocephalic and atraumatic.  Right Ear: External ear normal.  Left Ear: External ear normal.  Nose: Nose normal.  Mouth/Throat: Oropharynx is clear and moist. No oropharyngeal exudate.  Eyes: Conjunctivae are normal. Pupils are equal, round, and reactive to light. Right eye exhibits no discharge. Left eye exhibits no discharge. No scleral icterus.  Neck: Normal range of motion. Neck supple. No tracheal deviation present. No thyromegaly present.  Cardiovascular: Normal rate, regular rhythm, normal heart sounds and intact distal pulses.  Exam reveals no gallop and no friction rub.   No murmur heard. Pulmonary/Chest: Effort normal and breath sounds normal. No  accessory muscle usage. No tachypnea. No respiratory distress. She has no decreased breath sounds. She has no wheezes. She has no rales. She exhibits no tenderness. Right breast exhibits no inverted nipple, no mass, no nipple discharge, no skin change and no tenderness. Left breast exhibits no inverted nipple, no mass, no nipple discharge, no skin change and no tenderness. Breasts are symmetrical.  Abdominal: Soft. Bowel sounds are normal. She exhibits no distension and no mass. There is no tenderness. There is no rebound and no guarding.  Musculoskeletal: Normal range of motion. She exhibits no edema or tenderness.  Lymphadenopathy:    She has no cervical adenopathy.  Neurological: She is alert and oriented to person, place, and time. No cranial nerve deficit. She exhibits normal muscle tone. Coordination normal.  Skin: Skin is warm and dry. No rash noted. She is not diaphoretic. No erythema. No pallor.  Psychiatric: She has a normal mood and affect. Her behavior is normal. Judgment and thought content normal.          Assessment & Plan:   Problem List Items Addressed This Visit      Unprioritized   Routine general medical examination at a health care facility - Primary    General medical exam normal today including breast exam. PAP and pelvic deferred as PAP normal, HPV neg in 2014. Mammogram ordered. Immunizations UTD. Labs reviewed. Encouraged healthy diet and exercise.      Relevant Orders   MM Digital Screening       Return in about 1 year (around 01/28/2016) for Physical.

## 2015-01-28 NOTE — Patient Instructions (Signed)

## 2015-02-22 ENCOUNTER — Encounter: Payer: Self-pay | Admitting: *Deleted

## 2015-02-22 LAB — HM MAMMOGRAPHY: HM Mammogram: NEGATIVE

## 2015-12-09 ENCOUNTER — Encounter: Payer: Self-pay | Admitting: Family Medicine

## 2015-12-09 ENCOUNTER — Ambulatory Visit (INDEPENDENT_AMBULATORY_CARE_PROVIDER_SITE_OTHER): Payer: 59 | Admitting: Family Medicine

## 2015-12-09 VITALS — BP 112/78 | HR 86 | Temp 98.2°F | Wt 134.0 lb

## 2015-12-09 DIAGNOSIS — J069 Acute upper respiratory infection, unspecified: Secondary | ICD-10-CM | POA: Insufficient documentation

## 2015-12-09 DIAGNOSIS — B9789 Other viral agents as the cause of diseases classified elsewhere: Secondary | ICD-10-CM

## 2015-12-09 MED ORDER — FLUTICASONE PROPIONATE 50 MCG/ACT NA SUSP: 2.0000 | Freq: Every day | NASAL | Status: DC

## 2015-12-09 NOTE — Patient Instructions (Addendum)
This is viral/allergic.  Give it some time.   Take the claritin and flonase.  Call if you fail to improve or worsen.  Take care  Dr. Adriana Simasook

## 2015-12-09 NOTE — Assessment & Plan Note (Signed)
New problem. No evidence of bacterial infection at this time. Advised supportive care and over-the-counter antihistamines. Adding Flonase today. Patient to call or return if she fails to improve or worsens.

## 2015-12-09 NOTE — Progress Notes (Signed)
Pre visit review using our clinic review tool, if applicable. No additional management support is needed unless otherwise documented below in the visit note. 

## 2015-12-09 NOTE — Progress Notes (Signed)
Subjective:  Patient ID: Sylvia [name], female    DOB: 1956/08/15  Age: 60 y.o. MRN: [id number]  CC: Cough, congestion; concern for sinus infection  HPI:  60 year old female presents to clinic today with the above complaints.  Patient states that she's not been feeling well since Friday. She's been experiencing congestion and sinus pressure, associated headache, and cough. She also reports teeth pain. No reported fever. No chills. She states that her cough is productive of clear sputum. She's had clear nasal discharge. She's been using Claritin and over-the-counter sinus medication as well as Coricidin with no resolution in her symptoms. No known exacerbating factors. She thinks that allergies may be playing a role.  Social Hx   Social History   Social History  . Marital Status: Married    Spouse Name: N/A  . Number of Children: 2  . Years of Education: N/A   Occupational History  . Employed - [employer]    Social History Main Topics  . Smoking status: Never Smoker   . Smokeless tobacco: Never Used  . Alcohol Use: No  . Drug Use: No  . Sexual Activity: Not Asked   Other Topics Concern  . None   Social History Narrative   Regular Exercise -  NO   Daily Caffeine Use:  1 cup coffee AM         Review of Systems  Constitutional: Negative for fever.  HENT: Positive for congestion, rhinorrhea and sinus pressure.   Respiratory: Positive for cough.    Objective:  BP 112/78 mmHg  Pulse 86  Temp(Src) 98.2 F (36.8 C) (Oral)  Wt 134 lb (60.782 kg)  SpO2 93%  BP/Weight 12/09/2015 01/28/2015 05/15/2014  Systolic BP 112 99 112  Diastolic BP 78 63 70  Wt. (Lbs) 134 130.38 126.75  BMI 23.36 22.73 22.1   Physical Exam  Constitutional: She is oriented to person, place, and time. She appears well-developed. No distress.  HENT:  Head: Normocephalic and atraumatic.  Mouth/Throat: Oropharynx is clear and moist.  Normal TMs bilaterally. Mild maxillary sinus tenderness to  palpation.  Neck: Neck supple.  Cardiovascular: Normal rate and regular rhythm.   Pulmonary/Chest: Effort normal and breath sounds normal. She has no wheezes. She has no rales.  Lymphadenopathy:    She has no cervical adenopathy.  Neurological: She is alert and oriented to person, place, and time.  Vitals reviewed.  Lab Results  Component Value Date   WBC 3.8 01/21/2015   HGB 11.5 12/13/2012   HCT 39.6 01/21/2015   PLT 246 01/21/2015   GLUCOSE 86 01/21/2015   CHOL 167 01/21/2015   TRIG 48 01/21/2015   HDL 91 01/21/2015   LDLCALC 66 01/21/2015   ALT 15 01/21/2015   AST 22 01/21/2015   NA 141 01/21/2015   K 4.4 01/21/2015   CL 101 01/21/2015   CREATININE 0.63 01/21/2015   BUN 15 01/21/2015   CO2 24 01/21/2015   TSH 1.410 01/21/2015   HGBA1C 5.8* 01/21/2015   Assessment & Plan:   Problem List Items Addressed This Visit    URI (upper respiratory infection) - Primary    New problem. No evidence of bacterial infection at this time. Advised supportive care and over-the-counter antihistamines. Adding Flonase today. Patient to call or return if she fails to improve or worsens.        Meds ordered this encounter  Medications  . fluticasone (FLONASE) 50 MCG/ACT nasal spray    Sig: Place 2 sprays into both nostrils  daily.    Dispense:  16 g    Refill:  6   Follow-up: PRN  Everlene Other DO [facility]

## 2016-02-03 ENCOUNTER — Encounter: Payer: Self-pay | Admitting: Internal Medicine

## 2016-02-03 ENCOUNTER — Ambulatory Visit (INDEPENDENT_AMBULATORY_CARE_PROVIDER_SITE_OTHER): Payer: 59 | Admitting: Internal Medicine

## 2016-02-03 VITALS — BP 122/70 | HR 77 | Temp 97.7°F | Wt 135.4 lb

## 2016-02-03 DIAGNOSIS — L02219 Cutaneous abscess of trunk, unspecified: Secondary | ICD-10-CM

## 2016-02-03 DIAGNOSIS — L03319 Cellulitis of trunk, unspecified: Secondary | ICD-10-CM | POA: Diagnosis not present

## 2016-02-03 MED ORDER — DOXYCYCLINE HYCLATE 100 MG PO TABS: 100.0000 mg | ORAL_TABLET | Freq: Two times a day (BID) | ORAL | Status: DC

## 2016-02-03 MED ORDER — GENTAMICIN SULFATE 0.1 % EX OINT: 1.0000 "application " | TOPICAL_OINTMENT | Freq: Three times a day (TID) | CUTANEOUS | Status: DC

## 2016-02-03 NOTE — Patient Instructions (Signed)
Start Doxycycline 100mg  twice daily.  Use pea-sized amount of Gentamicin to wound 2-3 times daily.  Call immediately if worsening pain, redness, or drainage.   Follow up with dermatologist.

## 2016-02-03 NOTE — Progress Notes (Signed)
   Subjective:    Patient ID: Sylvia [name], female    DOB: 07/19/1956, 60 y.o.   MRN: [id number]  HPI  59YO female presents for acute visit.  Abscess- First developed years ago, then Friday night was much more sore and large. Ruptured last night. No fever or chills.  No previous MRSA.   Wt Readings from Last 3 Encounters:  02/03/16 135 lb 6.4 oz (61.417 kg)  12/09/15 134 lb (60.782 kg)  01/28/15 130 lb 6 oz (59.138 kg)   BP Readings from Last 3 Encounters:  02/03/16 122/70  12/09/15 112/78  01/28/15 99/63    Past Medical History  Diagnosis Date  . Hyperlipidemia   . Vitamin D deficiency    Family History  Problem Relation Age of Onset  . Cancer Mother [id number]    Hormone sensitive Breast Ca   . Hypertension Mother   . Cancer Father 1758    Lung Ca   Past Surgical History  Procedure Laterality Date  . Vaginal delivery      x2  . Knee arthroscopy  1983, 2014    left  . Novasure ablation  2010    Dr. Laurel DimmerK-J  . Cyst excision  2015    Dr. Roseanne KaufmanIsenstein   Social History   Social History  . Marital Status: Married    Spouse Name: N/A  . Number of Children: 2  . Years of Education: N/A   Occupational History  . Employed - [employer]    Social History Main Topics  . Smoking status: Never Smoker   . Smokeless tobacco: Never Used  . Alcohol Use: No  . Drug Use: No  . Sexual Activity: Not Asked   Other Topics Concern  . None   Social History Narrative   Regular Exercise -  NO   Daily Caffeine Use:  1 cup coffee AM          Review of Systems  Constitutional: Negative for fever, chills and fatigue.  Skin: Positive for color change and wound.       Objective:    BP 122/70 mmHg  Pulse 77  Temp(Src) 97.7 F (36.5 C)  Wt 135 lb 6.4 oz (61.417 kg)  SpO2 99% Physical Exam  Constitutional: She appears well-developed and well-nourished. No distress.  HENT:  Head: Normocephalic.  Neck: Normal range of motion.  Pulmonary/Chest: Effort normal.  Abdominal:     Skin: She is not diaphoretic.          Assessment & Plan:   Problem List Items Addressed This Visit      Unprioritized   Cellulitis and abscess of trunk - Primary    Abscess drained today with gentle pressure. Pt tolerated well. Pain improved. Will start topical Gentamicin and oral Doxycycline. Return precautions given. Follow up with dermatology.      Relevant Medications   gentamicin ointment (GARAMYCIN) 0.1 %   doxycycline (VIBRA-TABS) 100 MG tablet   Other Relevant Orders   Wound culture   Ambulatory referral to Dermatology       Return if symptoms worsen or fail to improve.  Ronna PolioJennifer Walker, MD Internal Medicine [facility]

## 2016-02-03 NOTE — Assessment & Plan Note (Addendum)
Abscess drained today with gentle pressure. Pt tolerated well. Pain improved. Will start topical Gentamicin and oral Doxycycline. Return precautions given. Follow up with dermatology.

## 2016-02-06 LAB — WOUND CULTURE
Gram Stain: NONE SEEN
Gram Stain: NONE SEEN
Gram Stain: NONE SEEN
ORGANISM ID, BACTERIA: NO GROWTH

## 2016-02-09 ENCOUNTER — Encounter: Payer: 59 | Admitting: Internal Medicine

## 2016-02-25 ENCOUNTER — Other Ambulatory Visit: Payer: Self-pay | Admitting: Internal Medicine

## 2016-03-13 ENCOUNTER — Other Ambulatory Visit: Payer: Self-pay | Admitting: Internal Medicine

## 2016-03-14 LAB — COMPREHENSIVE METABOLIC PANEL
A/G RATIO: 2 (ref 1.2–2.2)
ALBUMIN: 4.4 g/dL (ref 3.6–4.8)
ALK PHOS: 83 IU/L (ref 39–117)
ALT: 16 IU/L (ref 0–32)
AST: 22 IU/L (ref 0–40)
BILIRUBIN TOTAL: 0.4 mg/dL (ref 0.0–1.2)
BUN / CREAT RATIO: 22 (ref 12–28)
BUN: 15 mg/dL (ref 8–27)
CHLORIDE: 104 mmol/L (ref 96–106)
CO2: 23 mmol/L (ref 18–29)
Calcium: 9.5 mg/dL (ref 8.7–10.3)
Creatinine, Ser: 0.69 mg/dL (ref 0.57–1.00)
GFR calc non Af Amer: 95 mL/min/{1.73_m2} (ref >59)
GFR, EST AFRICAN AMERICAN: 109 mL/min/{1.73_m2} (ref >59)
GLUCOSE: 83 mg/dL (ref 65–99)
Globulin, Total: 2.2 g/dL (ref 1.5–4.5)
POTASSIUM: 4.4 mmol/L (ref 3.5–5.2)
Sodium: 142 mmol/L (ref 134–144)
TOTAL PROTEIN: 6.6 g/dL (ref 6.0–8.5)

## 2016-03-14 LAB — LIPID PANEL W/O CHOL/HDL RATIO
CHOLESTEROL TOTAL: 161 mg/dL (ref 100–199)
HDL: 78 mg/dL (ref >39)
LDL Calculated: 69 mg/dL (ref 0–99)
Triglycerides: 69 mg/dL (ref 0–149)
VLDL CHOLESTEROL CAL: 14 mg/dL (ref 5–40)

## 2016-03-17 ENCOUNTER — Encounter: Payer: Self-pay | Admitting: Internal Medicine

## 2016-03-17 ENCOUNTER — Ambulatory Visit (INDEPENDENT_AMBULATORY_CARE_PROVIDER_SITE_OTHER): Payer: 59 | Admitting: Internal Medicine

## 2016-03-17 VITALS — BP 122/78 | HR 83 | Ht 63.0 in | Wt 131.0 lb

## 2016-03-17 DIAGNOSIS — Z0000 Encounter for general adult medical examination without abnormal findings: Secondary | ICD-10-CM

## 2016-03-17 NOTE — Progress Notes (Signed)
Pre visit review using our clinic review tool, if applicable. No additional management support is needed unless otherwise documented below in the visit note. 

## 2016-03-17 NOTE — Patient Instructions (Signed)
Health Maintenance, Female Adopting a healthy lifestyle and getting preventive care can go a long way to promote health and wellness. Talk with your health care provider about what schedule of regular examinations is right for you. This is a good chance for you to check in with your provider about disease prevention and staying healthy. In between checkups, there are plenty of things you can do on your own. Experts have done a lot of research about which lifestyle changes and preventive measures are most likely to keep you healthy. Ask your health care provider for more information. WEIGHT AND DIET  Eat a healthy diet  Be sure to include plenty of vegetables, fruits, low-fat dairy products, and lean protein.  Do not eat a lot of foods high in solid fats, added sugars, or salt.  Get regular exercise. This is one of the most important things you can do for your health.  Most adults should exercise for at least 150 minutes each week. The exercise should increase your heart rate and make you sweat (moderate-intensity exercise).  Most adults should also do strengthening exercises at least twice a week. This is in addition to the moderate-intensity exercise.  Maintain a healthy weight  Body mass index (BMI) is a measurement that can be used to identify possible weight problems. It estimates body fat based on height and weight. Your health care provider can help determine your BMI and help you achieve or maintain a healthy weight.  For females 20 years of age and older:   A BMI below 18.5 is considered underweight.  A BMI of 18.5 to 24.9 is normal.  A BMI of 25 to 29.9 is considered overweight.  A BMI of 30 and above is considered obese.  Watch levels of cholesterol and blood lipids  You should start having your blood tested for lipids and cholesterol at 60 years of age, then have this test every 5 years.  You may need to have your cholesterol levels checked more often if:  Your lipid  or cholesterol levels are high.  You are older than 60 years of age.  You are at high risk for heart disease.  CANCER SCREENING   Lung Cancer  Lung cancer screening is recommended for adults 55-80 years old who are at high risk for lung cancer because of a history of smoking.  A yearly low-dose CT scan of the lungs is recommended for people who:  Currently smoke.  Have quit within the past 15 years.  Have at least a 30-pack-year history of smoking. A pack year is smoking an average of one pack of cigarettes a day for 1 year.  Yearly screening should continue until it has been 15 years since you quit.  Yearly screening should stop if you develop a health problem that would prevent you from having lung cancer treatment.  Breast Cancer  Practice breast self-awareness. This means understanding how your breasts normally appear and feel.  It also means doing regular breast self-exams. Let your health care provider know about any changes, no matter how small.  If you are in your 20s or 30s, you should have a clinical breast exam (CBE) by a health care provider every 1-3 years as part of a regular health exam.  If you are 40 or older, have a CBE every year. Also consider having a breast X-ray (mammogram) every year.  If you have a family history of breast cancer, talk to your health care provider about genetic screening.  If you   are at high risk for breast cancer, talk to your health care provider about having an MRI and a mammogram every year.  Breast cancer gene (BRCA) assessment is recommended for women who have family members with BRCA-related cancers. BRCA-related cancers include:  Breast.  Ovarian.  Tubal.  Peritoneal cancers.  Results of the assessment will determine the need for genetic counseling and BRCA1 and BRCA2 testing. Cervical Cancer Your health care provider may recommend that you be screened regularly for cancer of the pelvic organs (ovaries, uterus, and  vagina). This screening involves a pelvic examination, including checking for microscopic changes to the surface of your cervix (Pap test). You may be encouraged to have this screening done every 3 years, beginning at age 21.  For women ages 30-65, health care providers may recommend pelvic exams and Pap testing every 3 years, or they may recommend the Pap and pelvic exam, combined with testing for human papilloma virus (HPV), every 5 years. Some types of HPV increase your risk of cervical cancer. Testing for HPV may also be done on women of any age with unclear Pap test results.  Other health care providers may not recommend any screening for nonpregnant women who are considered low risk for pelvic cancer and who do not have symptoms. Ask your health care provider if a screening pelvic exam is right for you.  If you have had past treatment for cervical cancer or a condition that could lead to cancer, you need Pap tests and screening for cancer for at least 20 years after your treatment. If Pap tests have been discontinued, your risk factors (such as having a new sexual partner) need to be reassessed to determine if screening should resume. Some women have medical problems that increase the chance of getting cervical cancer. In these cases, your health care provider may recommend more frequent screening and Pap tests. Colorectal Cancer  This type of cancer can be detected and often prevented.  Routine colorectal cancer screening usually begins at 60 years of age and continues through 60 years of age.  Your health care provider may recommend screening at an earlier age if you have risk factors for colon cancer.  Your health care provider may also recommend using home test kits to check for hidden blood in the stool.  A small camera at the end of a tube can be used to examine your colon directly (sigmoidoscopy or colonoscopy). This is done to check for the earliest forms of colorectal  cancer.  Routine screening usually begins at age 50.  Direct examination of the colon should be repeated every 5-10 years through 60 years of age. However, you may need to be screened more often if early forms of precancerous polyps or small growths are found. Skin Cancer  Check your skin from head to toe regularly.  Tell your health care provider about any new moles or changes in moles, especially if there is a change in a mole's shape or color.  Also tell your health care provider if you have a mole that is larger than the size of a pencil eraser.  Always use sunscreen. Apply sunscreen liberally and repeatedly throughout the day.  Protect yourself by wearing long sleeves, pants, a wide-brimmed hat, and sunglasses whenever you are outside. HEART DISEASE, DIABETES, AND HIGH BLOOD PRESSURE   High blood pressure causes heart disease and increases the risk of stroke. High blood pressure is more likely to develop in:  People who have blood pressure in the high end   of the normal range (130-139/85-89 mm Hg).  People who are overweight or obese.  People who are African American.  If you are 38-23 years of age, have your blood pressure checked every 3-5 years. If you are 61 years of age or older, have your blood pressure checked every year. You should have your blood pressure measured twice--once when you are at a hospital or clinic, and once when you are not at a hospital or clinic. Record the average of the two measurements. To check your blood pressure when you are not at a hospital or clinic, you can use:  An automated blood pressure machine at a pharmacy.  A home blood pressure monitor.  If you are between 45 years and 39 years old, ask your health care provider if you should take aspirin to prevent strokes.  Have regular diabetes screenings. This involves taking a blood sample to check your fasting blood sugar level.  If you are at a normal weight and have a low risk for diabetes,  have this test once every three years after 60 years of age.  If you are overweight and have a high risk for diabetes, consider being tested at a younger age or more often. PREVENTING INFECTION  Hepatitis B  If you have a higher risk for hepatitis B, you should be screened for this virus. You are considered at high risk for hepatitis B if:  You were born in a country where hepatitis B is common. Ask your health care provider which countries are considered high risk.  Your parents were born in a high-risk country, and you have not been immunized against hepatitis B (hepatitis B vaccine).  You have HIV or AIDS.  You use needles to inject street drugs.  You live with someone who has hepatitis B.  You have had sex with someone who has hepatitis B.  You get hemodialysis treatment.  You take certain medicines for conditions, including cancer, organ transplantation, and autoimmune conditions. Hepatitis C  Blood testing is recommended for:  Everyone born from [birth year] through 1965.  Anyone with known risk factors for hepatitis C. Sexually transmitted infections (STIs)  You should be screened for sexually transmitted infections (STIs) including gonorrhea and chlamydia if:  You are sexually active and are younger than 60 years of age.  You are older than 60 years of age and your health care provider tells you that you are at risk for this type of infection.  Your sexual activity has changed since you were last screened and you are at an increased risk for chlamydia or gonorrhea. Ask your health care provider if you are at risk.  If you do not have HIV, but are at risk, it may be recommended that you take a prescription medicine daily to prevent HIV infection. This is called pre-exposure prophylaxis (PrEP). You are considered at risk if:  You are sexually active and do not regularly use condoms or know the HIV status of your partner(s).  You take drugs by injection.  You are sexually  active with a partner who has HIV. Talk with your health care provider about whether you are at high risk of being infected with HIV. If you choose to begin PrEP, you should first be tested for HIV. You should then be tested every 3 months for as long as you are taking PrEP.  PREGNANCY   If you are premenopausal and you may become pregnant, ask your health care provider about preconception counseling.  If you may  become pregnant, take 400 to 800 micrograms (mcg) of folic acid every day.  If you want to prevent pregnancy, talk to your health care provider about birth control (contraception). OSTEOPOROSIS AND MENOPAUSE   Osteoporosis is a disease in which the bones lose minerals and strength with aging. This can result in serious bone fractures. Your risk for osteoporosis can be identified using a bone density scan.  If you are 61 years of age or older, or if you are at risk for osteoporosis and fractures, ask your health care provider if you should be screened.  Ask your health care provider whether you should take a calcium or vitamin D supplement to lower your risk for osteoporosis.  Menopause may have certain physical symptoms and risks.  Hormone replacement therapy may reduce some of these symptoms and risks. Talk to your health care provider about whether hormone replacement therapy is right for you.  HOME CARE INSTRUCTIONS   Schedule regular health, dental, and eye exams.  Stay current with your immunizations.   Do not use any tobacco products including cigarettes, chewing tobacco, or electronic cigarettes.  If you are pregnant, do not drink alcohol.  If you are breastfeeding, limit how much and how often you drink alcohol.  Limit alcohol intake to no more than 1 drink per day for nonpregnant women. One drink equals 12 ounces of beer, 5 ounces of wine, or 1 ounces of hard liquor.  Do not use street drugs.  Do not share needles.  Ask your health care provider for help if  you need support or information about quitting drugs.  Tell your health care provider if you often feel depressed.  Tell your health care provider if you have ever been abused or do not feel safe at home.   This information is not intended to replace advice given to you by your health care provider. Make sure you discuss any questions you have with your health care provider.   Document Released: 02/27/2011 Document Revised: 09/04/2014 Document Reviewed: 07/16/2013 Elsevier Interactive Patient Education [employer].

## 2016-03-17 NOTE — Progress Notes (Signed)
Subjective:    Patient ID: Sylvia [name], female    DOB: 06/10/1956, 60 y.o.   MRN: [id number]  HPI  60YO female presents for physical exam.  Generally, feeling well. No concerns today.  Wt Readings from Last 3 Encounters:  03/17/16 131 lb (59.421 kg)  02/03/16 135 lb 6.4 oz (61.417 kg)  12/09/15 134 lb (60.782 kg)   BP Readings from Last 3 Encounters:  03/17/16 122/78  02/03/16 122/70  12/09/15 112/78    Past Medical History  Diagnosis Date  . Hyperlipidemia   . Vitamin D deficiency    Family History  Problem Relation Age of Onset  . Cancer Mother [id number]    Hormone sensitive Breast Ca   . Hypertension Mother   . Cancer Father [id number]    Lung Ca   Past Surgical History  Procedure Laterality Date  . Vaginal delivery      x2  . Knee arthroscopy  1983, 2014    left  . Novasure ablation  2010    Dr. Laurel DimmerK-J  . Cyst excision  2015    Dr. Roseanne KaufmanIsenstein   Social History   Social History  . Marital Status: Married    Spouse Name: N/A  . Number of Children: 2  . Years of Education: N/A   Occupational History  . Employed - [employer]    Social History Main Topics  . Smoking status: Never Smoker   . Smokeless tobacco: Never Used  . Alcohol Use: No  . Drug Use: No  . Sexual Activity: Not Asked   Other Topics Concern  . None   Social History Narrative   Regular Exercise -  NO   Daily Caffeine Use:  1 cup coffee AM          Review of Systems  Constitutional: Negative for fever, chills, appetite change, fatigue and unexpected weight change.  HENT: Negative for congestion.   Eyes: Negative for visual disturbance.  Respiratory: Negative for cough, chest tightness and shortness of breath.   Cardiovascular: Negative for chest pain and leg swelling.  Gastrointestinal: Negative for nausea, vomiting, abdominal pain, diarrhea and constipation.  Musculoskeletal: Negative for myalgias and arthralgias.  Skin: Negative for color change and rash.  Neurological: Negative for  weakness.  Hematological: Negative for adenopathy. Does not bruise/bleed easily.  Psychiatric/Behavioral: Negative for suicidal ideas, sleep disturbance and dysphoric mood. The patient is not nervous/anxious.        Objective:    BP 122/78 mmHg  Pulse 83  Ht 5\' 3"  (1.6 m)  Wt 131 lb (59.421 kg)  BMI 23.21 kg/m2  SpO2 98% Physical Exam  Constitutional: She is oriented to person, place, and time. She appears well-developed and well-nourished. No distress.  HENT:  Head: Normocephalic and atraumatic.  Right Ear: External ear normal.  Left Ear: External ear normal.  Nose: Nose normal.  Mouth/Throat: Oropharynx is clear and moist. No oropharyngeal exudate.  Eyes: Conjunctivae are normal. Pupils are equal, round, and reactive to light. Right eye exhibits no discharge. Left eye exhibits no discharge. No scleral icterus.  Neck: Normal range of motion. Neck supple. No tracheal deviation present. No thyromegaly present.  Cardiovascular: Normal rate, regular rhythm, normal heart sounds and intact distal pulses.  Exam reveals no gallop and no friction rub.   No murmur heard. Pulmonary/Chest: Effort normal and breath sounds normal. No accessory muscle usage. No tachypnea. No respiratory distress. She has no decreased breath sounds. She has no wheezes. She has no rales. She exhibits  no tenderness. Right breast exhibits no inverted nipple, no mass, no nipple discharge, no skin change and no tenderness. Left breast exhibits no inverted nipple, no mass, no nipple discharge, no skin change and no tenderness. Breasts are symmetrical.  Abdominal: Soft. Bowel sounds are normal. She exhibits no distension and no mass. There is no tenderness. There is no rebound and no guarding.  Musculoskeletal: Normal range of motion. She exhibits no edema or tenderness.  Lymphadenopathy:    She has no cervical adenopathy.  Neurological: She is alert and oriented to person, place, and time. No cranial nerve deficit. She  exhibits normal muscle tone. Coordination normal.  Skin: Skin is warm and dry. No rash noted. She is not diaphoretic. No erythema. No pallor.  Psychiatric: She has a normal mood and affect. Her behavior is normal. Judgment and thought content normal.          Assessment & Plan:   Problem List Items Addressed This Visit      Unprioritized   Routine general medical examination at a health care facility - Primary    General medical exam including breast exam normal today. PAP and pelvic deferred, given PAP normal, HPV neg in 2014, she prefers 5 year follow up in 2019. Labs reviewed. Hep C screen added. Mammogram scheduled. Colonoscopy UTD. Immunizations UTD. Encouraged continued healthy diet and exercise.      Relevant Orders   MM Digital Screening       Return in about 6 months (around 09/17/2016) for New Patient.  Ronna Polio, MD Internal Medicine [facility]

## 2016-03-17 NOTE — Assessment & Plan Note (Addendum)
General medical exam including breast exam normal today. PAP and pelvic deferred, given PAP normal, HPV neg in 2014, she prefers 5 year follow up in 2019. Labs reviewed. Hep C screen added. Mammogram scheduled. Colonoscopy UTD. Immunizations UTD. Encouraged continued healthy diet and exercise.

## 2016-03-20 LAB — HM MAMMOGRAPHY

## 2016-04-12 ENCOUNTER — Other Ambulatory Visit: Payer: Self-pay | Admitting: Internal Medicine

## 2016-04-13 LAB — HEPATITIS C ANTIBODY (REFLEX)

## 2016-04-13 LAB — HCV COMMENT:

## 2016-04-22 ENCOUNTER — Encounter: Payer: Self-pay | Admitting: Internal Medicine

## 2016-04-24 NOTE — Progress Notes (Signed)
Noted thanks °

## 2016-05-08 NOTE — Telephone Encounter (Signed)
Mailed unread message to patient. thanks 

## 2016-09-18 ENCOUNTER — Ambulatory Visit: Payer: 59 | Admitting: Internal Medicine

## 2017-03-20 ENCOUNTER — Ambulatory Visit: Payer: 59 | Admitting: Internal Medicine

## 2017-03-22 ENCOUNTER — Ambulatory Visit (INDEPENDENT_AMBULATORY_CARE_PROVIDER_SITE_OTHER): Payer: 59 | Admitting: Internal Medicine

## 2017-03-22 ENCOUNTER — Encounter: Payer: Self-pay | Admitting: Internal Medicine

## 2017-03-22 ENCOUNTER — Other Ambulatory Visit: Payer: Self-pay | Admitting: Internal Medicine

## 2017-03-22 VITALS — BP 116/70 | HR 71 | Temp 98.3°F | Resp 15 | Ht 63.0 in | Wt 133.2 lb

## 2017-03-22 DIAGNOSIS — Z803 Family history of malignant neoplasm of breast: Secondary | ICD-10-CM | POA: Diagnosis not present

## 2017-03-22 DIAGNOSIS — E2839 Other primary ovarian failure: Secondary | ICD-10-CM

## 2017-03-22 DIAGNOSIS — Z1231 Encounter for screening mammogram for malignant neoplasm of breast: Secondary | ICD-10-CM

## 2017-03-22 DIAGNOSIS — E785 Hyperlipidemia, unspecified: Secondary | ICD-10-CM

## 2017-03-22 DIAGNOSIS — Z124 Encounter for screening for malignant neoplasm of cervix: Secondary | ICD-10-CM | POA: Diagnosis not present

## 2017-03-22 DIAGNOSIS — Z1239 Encounter for other screening for malignant neoplasm of breast: Secondary | ICD-10-CM

## 2017-03-22 DIAGNOSIS — E559 Vitamin D deficiency, unspecified: Secondary | ICD-10-CM | POA: Diagnosis not present

## 2017-03-22 DIAGNOSIS — R739 Hyperglycemia, unspecified: Secondary | ICD-10-CM | POA: Diagnosis not present

## 2017-03-22 NOTE — Progress Notes (Signed)
Patient ID: Sylvia [name], female    DOB: 1955/09/27  Age: 61 y.o. MRN: [id number]  The patient is here for annual physical examination and management of other chronic and acute problems. Former patient of Dr Sylvia [name],  Last seen by her July 2017  Labs done elsewhere and reviewed   colonoscopy normal  2012  Has been on statin therapy for over  Ten years   mammo and DEXA due  PAP smear due The risk factors are reflected in the social history.  The roster of all physicians providing medical care to patient - is listed in the Snapshot section of the chart.  Activities of daily living:  The patient is 100% independent in all ADLs: dressing, toileting, feeding as well as independent mobility  Home safety : The patient has smoke detectors in the home. They wear seatbelts.  There are no firearms at home. There is no violence in the home.   There is no risks for hepatitis, STDs or HIV. There is no   history of blood transfusion. They have no travel history to infectious disease endemic areas of the world.  The patient has seen their dentist in the last six month. They have seen their eye doctor in the last year.    They have deferred audiologic testing in the last year.  They do not  have excessive sun exposure. Discussed the need for sun protection: hats, long sleeves and use of sunscreen if there is significant sun exposure.   Diet: the importance of a healthy diet is discussed. They do have a healthy diet.  The benefits of regular aerobic exercise were discussed. She walks 4 times per week ,  20 minutes.   Depression screen: there are no signs or vegative symptoms of depression- irritability, change in appetite, anhedonia, sadness/tearfullness.  The following portions of the patient's history were reviewed and updated as appropriate: allergies, current medications, past family history, past medical history,  past surgical history, past social history  and problem list.  Visual acuity was  not assessed per patient preference since she has regular follow up with her ophthalmologist. Hearing and body mass index were assessed and reviewed.   During the course of the visit the patient was educated and counseled about appropriate screening and preventive services including : fall prevention , diabetes screening, nutrition counseling, colorectal cancer screening, and recommended immunizations.    CC: The primary encounter diagnosis was Breast cancer screening, high risk patient. Diagnoses of Cervical cancer screening, Vitamin D deficiency, Hyperlipidemia LDL goal <160, Estrogen deficiency, Elevated blood sugar, and Family hx-breast malignancy were also pertinent to this visit.  History Sylvia [name] has a past medical history of Hyperlipidemia and Vitamin D deficiency.   She has a past surgical history that includes Vaginal delivery; Knee arthroscopy (1983, 2014); Novasure ablation (2010); and Cyst excision (2015).   Her family history includes Breast cancer (age of onset: 7177) in her mother; Cancer (age of onset: 3558) in her father; Hypertension in her mother; Stroke in her maternal grandfather.She reports that she has never smoked. She has never used smokeless tobacco. She reports that she does not drink alcohol or use drugs.  Outpatient Medications Prior to Visit  Medication Sig Dispense Refill  . simvastatin (ZOCOR) 20 MG tablet Take 1 tablet by mouth  every evening 90 tablet 3  . fish oil-omega-3 fatty acids 1000 MG capsule Take 1 g by mouth daily.      No facility-administered medications prior to visit.  Review of Systems   Patient denies headache, fevers, malaise, unintentional weight loss, skin rash, eye pain, sinus congestion and sinus pain, sore throat, dysphagia,  hemoptysis , cough, dyspnea, wheezing, chest pain, palpitations, orthopnea, edema, abdominal pain, nausea, melena, diarrhea, constipation, flank pain, dysuria, hematuria, urinary  Frequency, nocturia, numbness,  tingling, seizures,  Focal weakness, Loss of consciousness,  Tremor, insomnia, depression, anxiety, and suicidal ideation.      Objective:  BP 116/70 (BP Location: Left Arm, Patient Position: Sitting, Cuff Size: Normal)   Pulse 71   Temp 98.3 F (36.8 C) (Oral)   Resp 15   Ht 5\' 3"  (1.6 m)   Wt 133 lb 3.2 oz (60.4 kg)   SpO2 99%   BMI 23.60 kg/m   Physical Exam  General appearance: alert, cooperative and appears stated age Head: Normocephalic, without obvious abnormality, atraumatic Eyes: conjunctivae/corneas clear. PERRL, EOM's intact. Fundi benign. Ears: normal TM's and external ear canals both ears Nose: Nares normal. Septum midline. Mucosa normal. No drainage or sinus tenderness. Throat: lips, mucosa, and tongue normal; teeth and gums normal Neck: no adenopathy, no carotid bruit, no JVD, supple, symmetrical, trachea midline and thyroid not enlarged, symmetric, no tenderness/mass/nodules Lungs: clear to auscultation bilaterally Breasts: normal appearance, no masses or tenderness Heart: regular rate and rhythm, S1, S2 normal, no murmur, click, rub or gallop Abdomen: soft, non-tender; bowel sounds normal; no masses,  no organomegaly Extremities: extremities normal, atraumatic, no cyanosis or edema Pulses: 2+ and symmetric Skin: Skin color, texture, turgor normal. No rashes or lesions Neurologic: Alert and oriented X 3, normal strength and tone. Normal symmetric reflexes. Normal coordination and gait.    Assessment & Plan:   Problem List Items Addressed This Visit    Elevated blood sugar    Her  random glucose is not  elevated and her previously elevated a1c of 6.2 is now 5.8   I recommend she continue to  follow a low glycemic index diet and particpate regularly in an aerobic  exercise activity.  We should check an A1c in 6 months.    Lab Results  Component Value Date   HGBA1C 5.8 (H) 01/21/2015         Family hx-breast malignancy    3d mammogram recommended;  Will  send to [facility]       Hyperlipidemia LDL goal <160    Unclear why she has been taking statins for ten years recommend suspension and recheck. In 3 months       Relevant Orders   Lipid panel    Other Visit Diagnoses    Breast cancer screening, high risk patient    -  Primary   Relevant Orders   MM SCREENING BREAST TOMO BILATERAL   Cervical cancer screening       Relevant Orders   Cytology - PAP   Vitamin D deficiency       Relevant Orders   VITAMIN D 25 Hydroxy (Vit-D Deficiency, Fractures)   Estrogen deficiency       Relevant Orders   DG Bone Density      I have discontinued Ms. Pelly's fish oil-omega-3 fatty acids. I am also having her maintain her simvastatin.  No orders of the defined types were placed in this encounter.   Medications Discontinued During This Encounter  Medication Reason  . fish oil-omega-3 fatty acids 1000 MG capsule Patient has not taken in last 30 days    Follow-up: No Follow-up on file.   Sherlene ShamsULLO, Dezire Turk L, MD

## 2017-03-22 NOTE — Patient Instructions (Addendum)
Very nice to meet you!!!  We discussed the following issues:  I don't think you need a statin anymore.  I recommend that you suspend your cholesterol medication for 3 months  and we wiill repeat fasting lipids then.     I recommend 1200 mg calcium and 1000 to 2000 Ius od Vitamin D3 daily  Mamm[facility] to be [facility]  thurs or Friday appt   There are several low carb "on the go" breakfast drinks andbreakfast  bars:  1) try the premixed protein drinks (Atkins, AdvantEdge  Are wideley available,  But the best tasting , highest protein one available is called " Premier Protein"; it is advocated by the bariatric surgeons for their patients and  available of < $2 serving at [facility] and  In bulk for $1.50/serving at [employer] and [employer]  .    Nutritional analysis :  160 cal  30 g protein  1 g sugar 50% calcium needs   [employer] Mart and [employer]   2) There are plenty of high protein  HIGH  carb cookies,  But only the following are low carb."  Look for them in the diet section of most grocery stores or vitamin shops,  where the protein shakes are  sold.   All of these have 5 g sugar varieties if you read the label  : Power crunch Atkins bars KIND : make sure you find the  "low glycemic index"  variety QUEST : (very high fiber,  And taste better after being microwaved for 5 second OUT OF THE WRAPPER)

## 2017-03-24 NOTE — Assessment & Plan Note (Signed)
3d mammogram recommended;  [facility]

## 2017-03-24 NOTE — Assessment & Plan Note (Signed)
Unclear why she has been taking statins for ten years recommend suspension and recheck. In 3 months

## 2017-03-24 NOTE — Assessment & Plan Note (Signed)
Her  random glucose is not  elevated and her previously elevated a1c of 6.2 is now 5.8   I recommend she continue to  follow a low glycemic index diet and particpate regularly in an aerobic  exercise activity.  We should check an A1c in 6 months.    Lab Results  Component Value Date   HGBA1C 5.8 (H) 01/21/2015

## 2017-03-31 LAB — PAP LB AND HPV HIGH-RISK
HPV, HIGH-RISK: NEGATIVE
PAP SMEAR COMMENT: 0

## 2017-04-03 ENCOUNTER — Encounter: Payer: Self-pay | Admitting: Internal Medicine

## 2017-04-17 NOTE — Telephone Encounter (Signed)
Mailed unread message to patient.  

## 2017-06-21 ENCOUNTER — Ambulatory Visit: Payer: 59

## 2017-06-21 ENCOUNTER — Other Ambulatory Visit: Payer: 59

## 2017-09-20 LAB — LIPID PANEL
CHOL/HDL RATIO: 2.8 ratio (ref 0.0–4.4)
Cholesterol, Total: 204 mg/dL — ABNORMAL HIGH (ref 100–199)
HDL: 74 mg/dL (ref >39)
LDL Calculated: 119 mg/dL — ABNORMAL HIGH (ref 0–99)
TRIGLYCERIDES: 55 mg/dL (ref 0–149)
VLDL CHOLESTEROL CAL: 11 mg/dL (ref 5–40)

## 2017-09-20 LAB — VITAMIN D 25 HYDROXY (VIT D DEFICIENCY, FRACTURES): Vit D, 25-Hydroxy: 22 ng/mL — ABNORMAL LOW (ref 30.0–100.0)

## 2017-09-21 ENCOUNTER — Encounter: Payer: Self-pay | Admitting: Internal Medicine

## 2017-09-24 ENCOUNTER — Ambulatory Visit (INDEPENDENT_AMBULATORY_CARE_PROVIDER_SITE_OTHER): Payer: Managed Care, Other (non HMO) | Admitting: Internal Medicine

## 2017-09-24 ENCOUNTER — Encounter: Payer: Self-pay | Admitting: Internal Medicine

## 2017-09-24 DIAGNOSIS — B9789 Other viral agents as the cause of diseases classified elsewhere: Secondary | ICD-10-CM | POA: Diagnosis not present

## 2017-09-24 DIAGNOSIS — J069 Acute upper respiratory infection, unspecified: Secondary | ICD-10-CM | POA: Diagnosis not present

## 2017-09-24 DIAGNOSIS — E559 Vitamin D deficiency, unspecified: Secondary | ICD-10-CM

## 2017-09-24 DIAGNOSIS — E785 Hyperlipidemia, unspecified: Secondary | ICD-10-CM | POA: Diagnosis not present

## 2017-09-24 MED ORDER — ZOSTER VAC RECOMB ADJUVANTED 50 MCG/0.5ML IM SUSR: 0.5000 mL | Freq: Once | INTRAMUSCULAR | 1 refills | Status: AC

## 2017-09-24 NOTE — Progress Notes (Signed)
Subjective:  Patient ID: Sylvia [name], female    DOB: 01-21-1956  Age: 62 y.o. MRN: [id number]  CC: Diagnoses of Hyperlipidemia LDL goal <160, Vitamin D deficiency, and Viral URI with cough were pertinent to this visit.  HPI Sylvia [name] presents for follow up on hyperlipidemia. Patient had been taking statin therapy for nearly a decade despite n history of CAD, hypertension or diabetes . She was tolerating medication. Patient  was advised to stop simvastatin after last visit and repeat fasting lipids were done last week to determine need for ongoing therapy.    LDL jumped from 69 to 119 without medications; however, her risk of CAD using the FRC risk is 4.5%  Still with minior cough from URI over holidsays       Outpatient Medications Prior to Visit  Medication Sig Dispense Refill  . simvastatin (ZOCOR) 20 MG tablet Take 1 tablet by mouth  every evening (Patient not taking: Reported on 09/24/2017) 90 tablet 3   No facility-administered medications prior to visit.     Review of Systems;  Patient denies headache, fevers, malaise, unintentional weight loss, skin rash, eye pain, sinus congestion and sinus pain, sore throat, dysphagia,  hemoptysis , cough, dyspnea, wheezing, chest pain, palpitations, orthopnea, edema, abdominal pain, nausea, melena, diarrhea, constipation, flank pain, dysuria, hematuria, urinary  Frequency, nocturia, numbness, tingling, seizures,  Focal weakness, Loss of consciousness,  Tremor, insomnia, depression, anxiety, and suicidal ideation.      Objective:  BP (!) 116/58 (BP Location: Left Arm, Patient Position: Sitting, Cuff Size: Normal)   Pulse 76   Temp 97.7 F (36.5 C) (Oral)   Resp 16   Ht 5\' 3"  (1.6 m)   Wt 140 lb 6.4 oz (63.7 kg)   SpO2 99%   BMI 24.87 kg/m   BP Readings from Last 3 Encounters:  09/24/17 (!) 116/58  03/22/17 116/70  03/17/16 122/78    Wt Readings from Last 3 Encounters:  09/24/17 140 lb 6.4 oz (63.7 kg)  03/22/17 133 lb  3.2 oz (60.4 kg)  03/17/16 131 lb (59.4 kg)    General appearance: alert, cooperative and appears stated age Ears: normal TM's and external ear canals both ears Throat: lips, mucosa, and tongue normal; teeth and gums normal Neck: no adenopathy, no carotid bruit, supple, symmetrical, trachea midline and thyroid not enlarged, symmetric, no tenderness/mass/nodules Back: symmetric, no curvature. ROM normal. No CVA tenderness. Lungs: clear to auscultation bilaterally Heart: regular rate and rhythm, S1, S2 normal, no murmur, click, rub or gallop Abdomen: soft, non-tender; bowel sounds normal; no masses,  no organomegaly Pulses: 2+ and symmetric Skin: Skin color, texture, turgor normal. No rashes or lesions Lymph nodes: Cervical, supraclavicular, and axillary nodes normal.  Lab Results  Component Value Date   HGBA1C 5.8 (H) 01/21/2015   HGBA1C 5.6 12/13/2012    Lab Results  Component Value Date   CREATININE 0.69 03/13/2016   CREATININE 0.63 01/21/2015   CREATININE 0.74 12/13/2012    Lab Results  Component Value Date   WBC 3.8 01/21/2015   HGB 13.0 01/21/2015   HCT 39.6 01/21/2015   PLT 246 01/21/2015   GLUCOSE 83 03/13/2016   CHOL 204 (H) 09/19/2017   TRIG 55 09/19/2017   HDL 74 09/19/2017   LDLCALC 119 (H) 09/19/2017   ALT 16 03/13/2016   AST 22 03/13/2016   NA 142 03/13/2016   K 4.4 03/13/2016   CL 104 03/13/2016   CREATININE 0.69 03/13/2016   BUN 15 03/13/2016  CO2 23 03/13/2016   TSH 1.410 01/21/2015   HGBA1C 5.8 (H) 01/21/2015    No results found.  Assessment & Plan:   Problem List Items Addressed This Visit    Hyperlipidemia LDL goal <160    Untreated lipid profile  Reviewed,  10 yr risk is 4.5%  No medications needed.       Viral URI with cough    Current exam is consistent with viral URI .  Marland Kitchen.  Recommended treatment with oral and topcal decongestants,and a 6 day prednisone  taper.        Vitamin D deficiency    Repeating Drisdol for 3 months           I have discontinued Sylvia [name]'s simvastatin. I am also having her start on Zoster Vaccine Adjuvanted and ergocalciferol.  Meds ordered this encounter  Medications  . Zoster Vaccine Adjuvanted [facility](SHINGRIX) injection    Sig: Inject 0.5 mLs into the muscle once for 1 dose.    Dispense:  1 each    Refill:  1  . ergocalciferol (DRISDOL) 50000 units capsule    Sig: Take 1 capsule (50,000 Units total) by mouth once a week.    Dispense:  12 capsule    Refill:  0    Medications Discontinued During This Encounter  Medication Reason  . simvastatin (ZOCOR) 20 MG tablet     Follow-up: Return in about 6 months (around 03/24/2018).   Sylvia Shamseresa L Cybill Uriegas, MD

## 2017-09-24 NOTE — Patient Instructions (Addendum)
Based on your current  untreated fasting lipid profile, the risk of clinically significant CAD is < 5% over the next 10 years, using the Framingham risk calculator. No therapy is needed based on the guidelines by the [employer] at this time.  Please continue to exercise regularly,  follow a Mediterranean style diet and and plan to repeat the fasting  labs in 1 year.    The ShingRx vaccine is now available in local pharmacies and is much more protective thant Zostavaxs,  It is therefore ADVISED for all interested adults over 50 to prevent shingles   If your insurance will cover it, you can return here for the 2 injections     Your cough may be coming from  post nasal drip (PND).  PND  can be treated with benadryl,  Which is  the most effective for drying you up but it is also the most sedating,  So try taking it at night

## 2017-09-25 DIAGNOSIS — E559 Vitamin D deficiency, unspecified: Secondary | ICD-10-CM | POA: Insufficient documentation

## 2017-09-25 MED ORDER — ERGOCALCIFEROL 1.25 MG (50000 UT) PO CAPS: 50000.0000 [IU] | ORAL_CAPSULE | ORAL | 0 refills | Status: DC

## 2017-09-25 NOTE — Assessment & Plan Note (Signed)
Repeating Drisdol for 3 months

## 2017-09-25 NOTE — Assessment & Plan Note (Signed)
Current exam is consistent with viral URI .  Marland Kitchen.  Recommended treatment with oral and topcal decongestants,and a 6 day prednisone  taper.

## 2017-09-25 NOTE — Assessment & Plan Note (Signed)
Untreated lipid profile  Reviewed,  10 yr risk is 4.5%  No medications needed.

## 2017-10-18 ENCOUNTER — Encounter: Payer: Self-pay | Admitting: Internal Medicine

## 2017-10-18 ENCOUNTER — Ambulatory Visit
Admission: RE | Admit: 2017-10-18 | Discharge: 2017-10-18 | Disposition: A | Discharge status: Home / Self Care | Payer: Managed Care, Other (non HMO) | Source: Ambulatory Visit | Attending: Internal Medicine | Admitting: Internal Medicine

## 2017-10-18 DIAGNOSIS — Z1239 Encounter for other screening for malignant neoplasm of breast: Secondary | ICD-10-CM

## 2017-10-18 DIAGNOSIS — E2839 Other primary ovarian failure: Secondary | ICD-10-CM

## 2017-11-15 ENCOUNTER — Ambulatory Visit (INDEPENDENT_AMBULATORY_CARE_PROVIDER_SITE_OTHER): Payer: Managed Care, Other (non HMO) | Admitting: *Deleted

## 2017-11-15 DIAGNOSIS — Z23 Encounter for immunization: Secondary | ICD-10-CM | POA: Diagnosis not present

## 2018-01-15 ENCOUNTER — Ambulatory Visit: Payer: Managed Care, Other (non HMO)

## 2018-01-17 ENCOUNTER — Ambulatory Visit (INDEPENDENT_AMBULATORY_CARE_PROVIDER_SITE_OTHER): Payer: Managed Care, Other (non HMO) | Admitting: *Deleted

## 2018-01-17 DIAGNOSIS — Z23 Encounter for immunization: Secondary | ICD-10-CM | POA: Diagnosis not present

## 2018-01-17 NOTE — Progress Notes (Signed)
Patient tolerated injection wll

## 2018-03-09 LAB — LIPID PANEL
CHOLESTEROL: 235 — AB (ref 0–200)
HDL: 89 — AB (ref 35–70)
LDL CALC: 136
Triglycerides: 49 (ref 40–160)

## 2018-03-09 LAB — BASIC METABOLIC PANEL
Creatinine: 0.7 (ref 0.5–1.1)
Glucose: 94

## 2018-03-09 LAB — HEMOGLOBIN A1C: HEMOGLOBIN A1C: 5.2

## 2018-03-25 ENCOUNTER — Encounter: Payer: Managed Care, Other (non HMO) | Admitting: Internal Medicine

## 2018-03-25 ENCOUNTER — Ambulatory Visit: Payer: Managed Care, Other (non HMO) | Admitting: Internal Medicine

## 2018-03-26 ENCOUNTER — Other Ambulatory Visit: Payer: Self-pay

## 2018-04-01 ENCOUNTER — Encounter: Payer: Self-pay | Admitting: Internal Medicine

## 2018-04-01 DIAGNOSIS — Z0000 Encounter for general adult medical examination without abnormal findings: Secondary | ICD-10-CM

## 2018-04-01 DIAGNOSIS — E559 Vitamin D deficiency, unspecified: Secondary | ICD-10-CM

## 2018-04-01 DIAGNOSIS — E785 Hyperlipidemia, unspecified: Secondary | ICD-10-CM

## 2018-04-02 NOTE — Telephone Encounter (Signed)
Pt just had A1C, BMP, and Lipid on 03/09/2018. Pt is due for annual physical on 04/18/2018. Pt is aware that she just had her lipid panel checked and would like to have it rechecked. Is it okay to recheck? I have ordered TSH, CMP, CBC, Vitamin D,

## 2018-04-03 NOTE — Telephone Encounter (Signed)
Printed orders in labs.  Please handle thanks

## 2018-04-09 ENCOUNTER — Telehealth: Payer: Self-pay

## 2018-04-09 DIAGNOSIS — Z0000 Encounter for general adult medical examination without abnormal findings: Secondary | ICD-10-CM

## 2018-04-09 NOTE — Telephone Encounter (Signed)
LMTCB. Need to let pt know that labs have been ordered and signed to be done at [facility] and that we have placed orders up front to be picked up.

## 2018-04-12 LAB — CBC WITH DIFFERENTIAL/PLATELET
Basophils Absolute: 0 10*3/uL (ref 0.0–0.2)
Basos: 1 %
EOS (ABSOLUTE): 0.1 10*3/uL (ref 0.0–0.4)
Eos: 2 %
Hematocrit: 39.9 % (ref 34.0–46.6)
Hemoglobin: 13.7 g/dL (ref 11.1–15.9)
IMMATURE GRANULOCYTES: 0 %
Immature Grans (Abs): 0 10*3/uL (ref 0.0–0.1)
LYMPHS: 38 %
Lymphocytes Absolute: 1.8 10*3/uL (ref 0.7–3.1)
MCH: 31.3 pg (ref 26.6–33.0)
MCHC: 34.3 g/dL (ref 31.5–35.7)
MCV: 91 fL (ref 79–97)
MONOS ABS: 0.4 10*3/uL (ref 0.1–0.9)
Monocytes: 8 %
NEUTROS PCT: 51 %
Neutrophils Absolute: 2.4 10*3/uL (ref 1.4–7.0)
PLATELETS: 250 10*3/uL (ref 150–450)
RBC: 4.38 x10E6/uL (ref 3.77–5.28)
RDW: 13.9 % (ref 12.3–15.4)
WBC: 4.6 10*3/uL (ref 3.4–10.8)

## 2018-04-18 ENCOUNTER — Ambulatory Visit (INDEPENDENT_AMBULATORY_CARE_PROVIDER_SITE_OTHER): Payer: Managed Care, Other (non HMO) | Admitting: Internal Medicine

## 2018-04-18 ENCOUNTER — Encounter: Payer: Self-pay | Admitting: Internal Medicine

## 2018-04-18 VITALS — BP 112/68 | HR 89 | Temp 98.0°F | Resp 14 | Ht 63.0 in | Wt 135.4 lb

## 2018-04-18 DIAGNOSIS — J3089 Other allergic rhinitis: Secondary | ICD-10-CM

## 2018-04-18 DIAGNOSIS — Z803 Family history of malignant neoplasm of breast: Secondary | ICD-10-CM

## 2018-04-18 DIAGNOSIS — E785 Hyperlipidemia, unspecified: Secondary | ICD-10-CM | POA: Diagnosis not present

## 2018-04-18 DIAGNOSIS — Z0000 Encounter for general adult medical examination without abnormal findings: Secondary | ICD-10-CM | POA: Diagnosis not present

## 2018-04-18 DIAGNOSIS — J3081 Allergic rhinitis due to animal (cat) (dog) hair and dander: Secondary | ICD-10-CM

## 2018-04-18 DIAGNOSIS — E559 Vitamin D deficiency, unspecified: Secondary | ICD-10-CM

## 2018-04-18 NOTE — Patient Instructions (Addendum)
Cod liver oil is a great natural source of Vitamin  D and will suffice as your fish oil too   I will recommend an amount once I see your labs  Weight bearing exercise is much easier at a gym (more choices)  Health Maintenance for Postmenopausal Women Menopause is a normal process in which your reproductive ability comes to an end. This process happens gradually over a span of months to years, usually between the ages of 42 and 15. Menopause is complete when you have missed 12 consecutive menstrual periods. It is important to talk with your health care provider about some of the most common conditions that affect postmenopausal women, such as heart disease, cancer, and bone loss (osteoporosis). Adopting a healthy lifestyle and getting preventive care can help to promote your health and wellness. Those actions can also lower your chances of developing some of these common conditions. What should I know about menopause? During menopause, you may experience a number of symptoms, such as:  Moderate-to-severe hot flashes.  Night sweats.  Decrease in sex drive.  Mood swings.  Headaches.  Tiredness.  Irritability.  Memory problems.  Insomnia.  Choosing to treat or not to treat menopausal changes is an individual decision that you make with your health care provider. What should I know about hormone replacement therapy and supplements? Hormone therapy products are effective for treating symptoms that are associated with menopause, such as hot flashes and night sweats. Hormone replacement carries certain risks, especially as you become older. If you are thinking about using estrogen or estrogen with progestin treatments, discuss the benefits and risks with your health care provider. What should I know about heart disease and stroke? Heart disease, heart attack, and stroke become more likely as you age. This may be due, in part, to the hormonal changes that your body experiences during  menopause. These can affect how your body processes dietary fats, triglycerides, and cholesterol. Heart attack and stroke are both medical emergencies. There are many things that you can do to help prevent heart disease and stroke:  Have your blood pressure checked at least every 1-2 years. High blood pressure causes heart disease and increases the risk of stroke.  If you are 31-6 years old, ask your health care provider if you should take aspirin to prevent a heart attack or a stroke.  Do not use any tobacco products, including cigarettes, chewing tobacco, or electronic cigarettes. If you need help quitting, ask your health care provider.  It is important to eat a healthy diet and maintain a healthy weight. ? Be sure to include plenty of vegetables, fruits, low-fat dairy products, and lean protein. ? Avoid eating foods that are high in solid fats, added sugars, or salt (sodium).  Get regular exercise. This is one of the most important things that you can do for your health. ? Try to exercise for at least 150 minutes each week. The type of exercise that you do should increase your heart rate and make you sweat. This is known as moderate-intensity exercise. ? Try to do strengthening exercises at least twice each week. Do these in addition to the moderate-intensity exercise.  Know your numbers.Ask your health care provider to check your cholesterol and your blood glucose. Continue to have your blood tested as directed by your health care provider.  What should I know about cancer screening? There are several types of cancer. Take the following steps to reduce your risk and to catch any cancer development as early  as possible. Breast Cancer  Practice breast self-awareness. ? This means understanding how your breasts normally appear and feel. ? It also means doing regular breast self-exams. Let your health care provider know about any changes, no matter how small.  If you are 15 or older,  have a clinician do a breast exam (clinical breast exam or CBE) every year. Depending on your age, family history, and medical history, it may be recommended that you also have a yearly breast X-ray (mammogram).  If you have a family history of breast cancer, talk with your health care provider about genetic screening.  If you are at high risk for breast cancer, talk with your health care provider about having an MRI and a mammogram every year.  Breast cancer (BRCA) gene test is recommended for women who have family members with BRCA-related cancers. Results of the assessment will determine the need for genetic counseling and BRCA1 and for BRCA2 testing. BRCA-related cancers include these types: ? Breast. This occurs in males or females. ? Ovarian. ? Tubal. This may also be called fallopian tube cancer. ? Cancer of the abdominal or pelvic lining (peritoneal cancer). ? Prostate. ? Pancreatic.  Cervical, Uterine, and Ovarian Cancer Your health care provider may recommend that you be screened regularly for cancer of the pelvic organs. These include your ovaries, uterus, and vagina. This screening involves a pelvic exam, which includes checking for microscopic changes to the surface of your cervix (Pap test).  For women ages 21-65, health care providers may recommend a pelvic exam and a Pap test every three years. For women ages 64-65, they may recommend the Pap test and pelvic exam, combined with testing for human papilloma virus (HPV), every five years. Some types of HPV increase your risk of cervical cancer. Testing for HPV may also be done on women of any age who have unclear Pap test results.  Other health care providers may not recommend any screening for nonpregnant women who are considered low risk for pelvic cancer and have no symptoms. Ask your health care provider if a screening pelvic exam is right for you.  If you have had past treatment for cervical cancer or a condition that could  lead to cancer, you need Pap tests and screening for cancer for at least 20 years after your treatment. If Pap tests have been discontinued for you, your risk factors (such as having a new sexual partner) need to be reassessed to determine if you should start having screenings again. Some women have medical problems that increase the chance of getting cervical cancer. In these cases, your health care provider may recommend that you have screening and Pap tests more often.  If you have a family history of uterine cancer or ovarian cancer, talk with your health care provider about genetic screening.  If you have vaginal bleeding after reaching menopause, tell your health care provider.  There are currently no reliable tests available to screen for ovarian cancer.  Lung Cancer Lung cancer screening is recommended for adults 95-73 years old who are at high risk for lung cancer because of a history of smoking. A yearly low-dose CT scan of the lungs is recommended if you:  Currently smoke.  Have a history of at least 30 pack-years of smoking and you currently smoke or have quit within the past 15 years. A pack-year is smoking an average of one pack of cigarettes per day for one year.  Yearly screening should:  Continue until it has been 15  years since you quit.  Stop if you develop a health problem that would prevent you from having lung cancer treatment.  Colorectal Cancer  This type of cancer can be detected and can often be prevented.  Routine colorectal cancer screening usually begins at age 68 and continues through age 23.  If you have risk factors for colon cancer, your health care provider may recommend that you be screened at an earlier age.  If you have a family history of colorectal cancer, talk with your health care provider about genetic screening.  Your health care provider may also recommend using home test kits to check for hidden blood in your stool.  A small camera at the  end of a tube can be used to examine your colon directly (sigmoidoscopy or colonoscopy). This is done to check for the earliest forms of colorectal cancer.  Direct examination of the colon should be repeated every 5-10 years until age 37. However, if early forms of precancerous polyps or small growths are found or if you have a family history or genetic risk for colorectal cancer, you may need to be screened more often.  Skin Cancer  Check your skin from head to toe regularly.  Monitor any moles. Be sure to tell your health care provider: ? About any new moles or changes in moles, especially if there is a change in a mole's shape or color. ? If you have a mole that is larger than the size of a pencil eraser.  If any of your family members has a history of skin cancer, especially at a young age, talk with your health care provider about genetic screening.  Always use sunscreen. Apply sunscreen liberally and repeatedly throughout the day.  Whenever you are outside, protect yourself by wearing long sleeves, pants, a wide-brimmed hat, and sunglasses.  What should I know about osteoporosis? Osteoporosis is a condition in which bone destruction happens more quickly than new bone creation. After menopause, you may be at an increased risk for osteoporosis. To help prevent osteoporosis or the bone fractures that can happen because of osteoporosis, the following is recommended:  If you are 20-65 years old, get at least 1,000 mg of calcium and at least 600 mg of vitamin D per day.  If you are older than age 12 but younger than age 40, get at least 1,200 mg of calcium and at least 600 mg of vitamin D per day.  If you are older than age 29, get at least 1,200 mg of calcium and at least 800 mg of vitamin D per day.  Smoking and excessive alcohol intake increase the risk of osteoporosis. Eat foods that are rich in calcium and vitamin D, and do weight-bearing exercises several times each week as directed  by your health care provider. What should I know about how menopause affects my mental health? Depression may occur at any age, but it is more common as you become older. Common symptoms of depression include:  Low or sad mood.  Changes in sleep patterns.  Changes in appetite or eating patterns.  Feeling an overall lack of motivation or enjoyment of activities that you previously enjoyed.  Frequent crying spells.  Talk with your health care provider if you think that you are experiencing depression. What should I know about immunizations? It is important that you get and maintain your immunizations. These include:  Tetanus, diphtheria, and pertussis (Tdap) booster vaccine.  Influenza every year before the flu season begins.  Pneumonia vaccine.  Shingles vaccine.  Your health care provider may also recommend other immunizations. This information is not intended to replace advice given to you by your health care provider. Make sure you discuss any questions you have with your health care provider. Document Released: 10/06/2005 Document Revised: 03/03/2016 Document Reviewed: 05/18/2015 Elsevier Interactive Patient Education  2018 [employer].

## 2018-04-18 NOTE — Progress Notes (Signed)
Patient ID: Sylvia [name], female    DOB: 15-Oct-1955  Age: 62 y.o. MRN: [id number]  The patient is here for annual Medicare wellness examination and management of other chronic and acute problems.  .      The risk factors are reflected in the social history.  The roster of all physicians providing medical care to patient - is listed in the Snapshot section of the chart.  Activities of daily living:  The patient is 100% independent in all ADLs: dressing, toileting, feeding as well as independent mobility  Home safety : The patient has smoke detectors in the home. They wear seatbelts.  There are no firearms at home. There is no violence in the home.   There is no risks for hepatitis, STDs or HIV. There is no   history of blood transfusion. They have no travel history to infectious disease endemic areas of the world.  The patient has seen their dentist in the last six month. They have seen their eye doctor in the last year. They admit to slight hearing difficulty with regard to whispered voices and some television programs.  They have deferred audiologic testing in the last year.  They do not  have excessive sun exposure. Discussed the need for sun protection: hats, long sleeves and use of sunscreen if there is significant sun exposure.   Diet: the importance of a healthy diet is discussed. They do have a healthy diet.  The benefits of regular aerobic exercise were discussed. She walks 4 times per week ,  20 minutes.   Depression screen: there are no signs or vegative symptoms of depression- irritability, change in appetite, anhedonia, sadness/tearfullness.  Cognitive assessment: the patient manages all their financial and personal affairs and is actively engaged. They could relate day,date,year and events; recalled 2/3 objects at 3 minutes; performed clock-face test normally.  The following portions of the patient's history were reviewed and updated as appropriate: allergies, current  medications, past family history, past medical history,  past surgical history, past social history  and problem list.  Visual acuity was not assessed per patient preference since she has regular follow up with her ophthalmologist. Hearing and body mass index were assessed and reviewed.   During the course of the visit the patient was educated and counseled about appropriate screening and preventive services including : fall prevention , diabetes screening, nutrition counseling, colorectal cancer screening, and recommended immunizations.    CC: The primary encounter diagnosis was Non-seasonal allergic rhinitis due to other allergic trigger. 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  There is no FH of CAD , but one 2nd degree family member had a cerebral hemorrhage. Feels better off of the statin .   2) allergic rhinitis:  Had a difficult time recently during visit with daughter with excessive rhinitis and sneezing.  Wants to b tested for allergies to dogs and trees   History Sylvia [name] has a past medical history of Hyperlipidemia and Vitamin D deficiency.   She has a past surgical history that includes Vaginal delivery; Knee arthroscopy (1983, 2014); Novasure ablation (2010); and Cyst excision (2015).   Her family history  includes Breast cancer (age of onset: 77) in her mother; Cancer (age of onset: 2) in her father; Hypertension in her mother; Stroke in her maternal grandfather.She reports that she has never smoked. She has never used smokeless tobacco. She reports that she does not drink alcohol or use drugs.  Outpatient Medications  Prior to Visit  Medication Sig Dispense Refill  . ergocalciferol (DRISDOL) 50000 units capsule Take 1 capsule (50,000 Units total) by mouth once a week. 12 capsule 0   No facility-administered medications prior to visit.     Review of Systems   Patient denies headache, fevers, malaise, unintentional weight loss, skin rash, eye pain, sinus congestion and sinus pain, sore throat, dysphagia,  hemoptysis , cough, dyspnea, wheezing, chest pain, palpitations, orthopnea, edema, abdominal pain, nausea, melena, diarrhea, constipation, flank pain, dysuria, hematuria, urinary  Frequency, nocturia, numbness, tingling, seizures,  Focal weakness, Loss of consciousness,  Tremor, insomnia, depression, anxiety, and suicidal ideation.      Objective:  BP 112/68 (BP Location: Left Arm, Patient Position: Sitting, Cuff Size: Normal)   Pulse 89   Temp 98 F (36.7 C) (Oral)   Resp 14   Ht '5\' 3"'  (1.6 m)   Wt 135 lb 6.4 oz (61.4 kg)   SpO2 98%   BMI 23.99 kg/m   Physical Exam   General appearance: alert, cooperative and appears stated age Head: Normocephalic, without obvious abnormality, atraumatic Eyes: conjunctivae/corneas clear. PERRL, EOM's intact. Fundi benign. Ears: normal TM's and external ear canals both ears Nose: Nares normal. Septum midline. Mucosa normal. No drainage or sinus tenderness. Throat: lips, mucosa, and tongue normal; teeth and gums normal Neck: no adenopathy, no carotid bruit, no JVD, supple, symmetrical, trachea midline and thyroid not enlarged, symmetric, no tenderness/mass/nodules Lungs: clear to auscultation bilaterally Breasts: normal appearance, no masses or tenderness Heart: regular rate and rhythm, S1, S2 normal, no murmur, click, rub or gallop Abdomen: soft, non-tender; bowel sounds normal; no masses,  no organomegaly Extremities: extremities normal, atraumatic, no cyanosis or edema Pulses: 2+ and symmetric Skin: Skin color, texture, turgor normal. No rashes or  lesions Neurologic: Alert and oriented X 3, normal strength and tone. Normal symmetric reflexes. Normal coordination and gait.      Assessment & Plan:   Problem List Items Addressed This Visit    Hyperlipidemia LDL goal <160    Untreated lipid profile  reviewed,  Her 10 yr risk  Remains very low at  4.5%  No medications needed  Lab Results  Component Value Date   CHOL 227 (H) 04/18/2018   HDL 80 04/18/2018   LDLCALC 125 (H) 04/18/2018   TRIG 109 04/18/2018   CHOLHDL 2.8 04/18/2018         Relevant Orders   Lipid Profile (Completed)   Family hx-breast malignancy    3d mammogram annually recommended;   [facility]       Routine general medical examination at a health care facility    Annual comprehensive preventive exam was done as well as an evaluation and management of chronic conditions .  During the course of the visit the patient was educated and counseled about appropriate screening and preventive services including :  diabetes screening, lipid analysis with projected  10 year  risk for CAD , nutrition counseling, breast, cervical and colorectal cancer screening, and recommended immunizations.  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  Continue prn use of antihistamines or steroid nasal spray        Other Visit Diagnoses    Non-seasonal allergic rhinitis due to other allergic trigger    -  Primary   Relevant Orders   Perennial allergen profile IgE   Allergen, Dog Dander, e5   Allergens (8) Trees      I have discontinued Sylvia [name]'s ergocalciferol.  No orders of the defined types were placed in this encounter.   Medications Discontinued  During This Encounter  Medication Reason  . ergocalciferol (DRISDOL) 50000 units capsule Completed Course    Follow-up: No follow-ups on file.   Crecencio Mc, MD

## 2018-04-19 LAB — LIPID PANEL
Chol/HDL Ratio: 2.8 ratio (ref 0.0–4.4)
Cholesterol, Total: 227 mg/dL — ABNORMAL HIGH (ref 100–199)
HDL: 80 mg/dL (ref >39)
LDL Calculated: 125 mg/dL — ABNORMAL HIGH (ref 0–99)
Triglycerides: 109 mg/dL (ref 0–149)
VLDL Cholesterol Cal: 22 mg/dL (ref 5–40)

## 2018-04-19 LAB — COMPREHENSIVE METABOLIC PANEL
A/G RATIO: 1.9 (ref 1.2–2.2)
ALK PHOS: 74 IU/L (ref 39–117)
ALT: 13 IU/L (ref 0–32)
AST: 19 IU/L (ref 0–40)
Albumin: 4.5 g/dL (ref 3.6–4.8)
BILIRUBIN TOTAL: 0.2 mg/dL (ref 0.0–1.2)
BUN/Creatinine Ratio: 29 — ABNORMAL HIGH (ref 12–28)
BUN: 19 mg/dL (ref 8–27)
CHLORIDE: 103 mmol/L (ref 96–106)
CO2: 25 mmol/L (ref 20–29)
Calcium: 9.6 mg/dL (ref 8.7–10.3)
Creatinine, Ser: 0.66 mg/dL (ref 0.57–1.00)
GFR calc non Af Amer: 95 mL/min/{1.73_m2} (ref >59)
GFR, EST AFRICAN AMERICAN: 109 mL/min/{1.73_m2} (ref >59)
Globulin, Total: 2.4 g/dL (ref 1.5–4.5)
Glucose: 100 mg/dL — ABNORMAL HIGH (ref 65–99)
POTASSIUM: 4.5 mmol/L (ref 3.5–5.2)
Sodium: 142 mmol/L (ref 134–144)
TOTAL PROTEIN: 6.9 g/dL (ref 6.0–8.5)

## 2018-04-19 LAB — TSH: TSH: 1.51 u[IU]/mL (ref 0.450–4.500)

## 2018-04-19 LAB — VITAMIN D 25 HYDROXY (VIT D DEFICIENCY, FRACTURES): VIT D 25 HYDROXY: 21.8 ng/mL — AB (ref 30.0–100.0)

## 2018-04-20 DIAGNOSIS — J310 Chronic rhinitis: Secondary | ICD-10-CM | POA: Insufficient documentation

## 2018-04-20 MED ORDER — ERGOCALCIFEROL 1.25 MG (50000 UT) PO CAPS: 50000.0000 [IU] | ORAL_CAPSULE | ORAL | 0 refills | Status: DC

## 2018-04-20 NOTE — Assessment & Plan Note (Signed)
Recurrent despite periodic treatment  Continue weekly megadose

## 2018-04-20 NOTE — Assessment & Plan Note (Signed)
seroloigc testing has been ordered.  Continue prn use of antihistamines or steroid nasal spray

## 2018-04-20 NOTE — Assessment & Plan Note (Signed)
3d mammogram annually recommended;   [facility]

## 2018-04-20 NOTE — Assessment & Plan Note (Addendum)
Untreated lipid profile  reviewed,  Her 10 yr risk  Remains very low at  4.5%  No medications needed  Lab Results  Component Value Date   CHOL 227 (H) 04/18/2018   HDL 80 04/18/2018   LDLCALC 125 (H) 04/18/2018   TRIG 109 04/18/2018   CHOLHDL 2.8 04/18/2018

## 2018-04-20 NOTE — Assessment & Plan Note (Signed)
Annual comprehensive preventive exam was done as well as an evaluation and management of chronic conditions .  During the course of the visit the patient was educated and counseled about appropriate screening and preventive services including :  diabetes screening, lipid analysis with projected  10 year  risk for CAD , nutrition counseling, breast, cervical and colorectal cancer screening, and recommended immunizations.  Printed recommendations for health maintenance screenings was give 

## 2018-04-22 LAB — ALLERGENS (8) TREES
Cottonwood IgE: <0.1 kU/L
Pine, White IgE: <0.1 kU/L
T012-IgE Willow: <0.1 kU/L
T015-IgE Ash, White: <0.1 kU/L
T044-IgE Hackberry: <0.1 kU/L
T210-IgE Privet, Common: <0.1 kU/L

## 2018-04-22 LAB — ALLERGEN PROFILE, PERENNIAL ALLERGEN IGE
Alternaria Alternata IgE: <0.1 kU/L
Aspergillus Fumigatus IgE: <0.1 kU/L
Candida Albicans IgE: <0.1 kU/L
Cat Dander IgE: <0.1 kU/L
Chicken Feathers IgE: <0.1 kU/L
Cladosporium Herbarum IgE: <0.1 kU/L
Cow Dander IgE: <0.1 kU/L
D Farinae IgE: <0.1 kU/L
D Pteronyssinus IgE: <0.1 kU/L
Goose Feathers IgE: <0.1 kU/L
Setomelanomma Rostrat: <0.1 kU/L

## 2018-04-22 LAB — ALLERGEN, DOG DANDER, E5

## 2018-04-23 ENCOUNTER — Encounter: Payer: Self-pay | Admitting: Internal Medicine

## 2018-10-07 DIAGNOSIS — Z1239 Encounter for other screening for malignant neoplasm of breast: Secondary | ICD-10-CM

## 2018-10-22 ENCOUNTER — Ambulatory Visit
Admission: RE | Admit: 2018-10-22 | Discharge: 2018-10-22 | Disposition: A | Discharge status: Home / Self Care | Payer: Managed Care, Other (non HMO) | Source: Ambulatory Visit | Attending: Internal Medicine | Admitting: Internal Medicine

## 2018-10-22 DIAGNOSIS — Z1231 Encounter for screening mammogram for malignant neoplasm of breast: Secondary | ICD-10-CM | POA: Insufficient documentation

## 2018-10-22 DIAGNOSIS — Z1239 Encounter for other screening for malignant neoplasm of breast: Secondary | ICD-10-CM | POA: Diagnosis present

## 2019-04-18 ENCOUNTER — Telehealth: Payer: Self-pay | Admitting: Internal Medicine

## 2019-04-18 NOTE — Telephone Encounter (Signed)
LMTCB

## 2019-04-18 NOTE — Telephone Encounter (Signed)
Pt does not want to come into office for her physical, do to COVID and the up coming flu season. Pt did not feel the need for a follow up virtual appt. Pt stated if Dr. Derrel Nip wanted labs to let her know, she will only do labs at [facility].

## 2019-04-18 NOTE — Telephone Encounter (Signed)
No labs needed  She is up to date on all screenings except  HIV which is optional .

## 2019-04-21 NOTE — Telephone Encounter (Signed)
Pt returning call. Pt states if you cant reach her it is okay to leave a message on mychart. Please advise.

## 2019-04-21 NOTE — Telephone Encounter (Signed)
Patient aware.

## 2019-04-23 ENCOUNTER — Encounter: Payer: Managed Care, Other (non HMO) | Admitting: Internal Medicine

## 2019-11-28 ENCOUNTER — Ambulatory Visit: Payer: Managed Care, Other (non HMO)

## 2019-11-29 ENCOUNTER — Ambulatory Visit: Payer: Managed Care, Other (non HMO) | Attending: Internal Medicine

## 2019-11-29 DIAGNOSIS — Z23 Encounter for immunization: Secondary | ICD-10-CM

## 2019-11-29 NOTE — Progress Notes (Signed)
   Covid-19 Vaccination Clinic  Name:  Sylvia [name]    MRN: [id number] DOB: [birth year]  11/29/2019  [name] was observed post Covid-19 immunization for 15 minutes without incident. She was provided with Vaccine Information Sheet and instruction to access the V-Safe system.   [name] was instructed to call 911 with any severe reactions post vaccine: Marland Kitchen Difficulty breathing  . Swelling of face and throat  . A fast heartbeat  . A bad rash all over body  . Dizziness and weakness   Immunizations Administered    Name Date Dose VIS Date Route   Pfizer COVID-19 Vaccine 11/29/2019  8:58 AM 0.3 mL 08/08/2019 Intramuscular   Manufacturer: [employer], [employer]   Lot: [id number]   NDC: [address]-0

## 2019-12-20 ENCOUNTER — Ambulatory Visit: Payer: Managed Care, Other (non HMO)

## 2019-12-23 ENCOUNTER — Ambulatory Visit: Payer: Managed Care, Other (non HMO) | Attending: Internal Medicine

## 2019-12-23 DIAGNOSIS — Z23 Encounter for immunization: Secondary | ICD-10-CM

## 2019-12-23 NOTE — Progress Notes (Signed)
   Covid-19 Vaccination Clinic  Name:  JALESHA PLOTZ    MRN: [id number] DOB: Jan 24, 1956  12/23/2019  Ms. Jarecki was observed post Covid-19 immunization for 15 minutes without incident. She was provided with Vaccine Information Sheet and instruction to access the V-Safe system.   Ms. Fryberger was instructed to call 911 with any severe reactions post vaccine: Marland Kitchen Difficulty breathing  . Swelling of face and throat  . A fast heartbeat  . A bad rash all over body  . Dizziness and weakness   Immunizations Administered    Name Date Dose VIS Date Route   Pfizer COVID-19 Vaccine 12/23/2019 10:05 AM 0.3 mL 10/22/2018 Intramuscular   Manufacturer: [employer], [employer]   Lot: [id number]   NDC: [address]-4

## 2020-01-22 ENCOUNTER — Other Ambulatory Visit: Payer: Self-pay | Admitting: Internal Medicine

## 2020-01-22 DIAGNOSIS — Z1231 Encounter for screening mammogram for malignant neoplasm of breast: Secondary | ICD-10-CM

## 2020-01-27 ENCOUNTER — Telehealth: Payer: Self-pay | Admitting: Internal Medicine

## 2020-01-27 DIAGNOSIS — Z0000 Encounter for general adult medical examination without abnormal findings: Secondary | ICD-10-CM

## 2020-01-27 DIAGNOSIS — E785 Hyperlipidemia, unspecified: Secondary | ICD-10-CM

## 2020-01-27 NOTE — Telephone Encounter (Signed)
Patient would like to have labs done before her appointment in August. 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

## 2020-01-29 NOTE — Telephone Encounter (Signed)
Pt would like to have her labs done prior to her physical in August. I have ordered CBC, CMP, TSH, A1c, Lipid panel. Is there anything else that needs to be ordered?

## 2020-02-24 ENCOUNTER — Ambulatory Visit
Admission: RE | Admit: 2020-02-24 | Discharge: 2020-02-24 | Disposition: A | Discharge status: Home / Self Care | Payer: Managed Care, Other (non HMO) | Source: Ambulatory Visit | Attending: Internal Medicine | Admitting: Internal Medicine

## 2020-02-24 DIAGNOSIS — Z1231 Encounter for screening mammogram for malignant neoplasm of breast: Secondary | ICD-10-CM | POA: Diagnosis not present

## 2020-04-08 LAB — COMPREHENSIVE METABOLIC PANEL
ALT: 12 IU/L (ref 0–32)
AST: 17 IU/L (ref 0–40)
Albumin/Globulin Ratio: 1.9 (ref 1.2–2.2)
Albumin: 4.9 g/dL — ABNORMAL HIGH (ref 3.8–4.8)
Alkaline Phosphatase: 109 IU/L (ref 48–121)
BUN/Creatinine Ratio: 22 (ref 12–28)
BUN: 18 mg/dL (ref 8–27)
Bilirubin Total: 0.5 mg/dL (ref 0.0–1.2)
CO2: 22 mmol/L (ref 20–29)
Calcium: 10 mg/dL (ref 8.7–10.3)
Chloride: 99 mmol/L (ref 96–106)
Creatinine, Ser: 0.83 mg/dL (ref 0.57–1.00)
GFR calc Af Amer: 86 mL/min/{1.73_m2} (ref >59)
GFR calc non Af Amer: 75 mL/min/{1.73_m2} (ref >59)
Globulin, Total: 2.6 g/dL (ref 1.5–4.5)
Glucose: 93 mg/dL (ref 65–99)
Potassium: 4.4 mmol/L (ref 3.5–5.2)
Sodium: 139 mmol/L (ref 134–144)
Total Protein: 7.5 g/dL (ref 6.0–8.5)

## 2020-04-08 LAB — LIPID PANEL
Chol/HDL Ratio: 3.5 ratio (ref 0.0–4.4)
Cholesterol, Total: 256 mg/dL — ABNORMAL HIGH (ref 100–199)
HDL: 73 mg/dL (ref >39)
LDL Chol Calc (NIH): 166 mg/dL — ABNORMAL HIGH (ref 0–99)
Triglycerides: 101 mg/dL (ref 0–149)
VLDL Cholesterol Cal: 17 mg/dL (ref 5–40)

## 2020-04-08 LAB — CBC WITH DIFFERENTIAL/PLATELET
Basophils Absolute: 0.1 10*3/uL (ref 0.0–0.2)
Basos: 1 %
EOS (ABSOLUTE): 0 10*3/uL (ref 0.0–0.4)
Eos: 1 %
Hematocrit: 44.5 % (ref 34.0–46.6)
Hemoglobin: 15 g/dL (ref 11.1–15.9)
Immature Grans (Abs): 0 10*3/uL (ref 0.0–0.1)
Immature Granulocytes: 0 %
Lymphocytes Absolute: 1.7 10*3/uL (ref 0.7–3.1)
Lymphs: 31 %
MCH: 31.4 pg (ref 26.6–33.0)
MCHC: 33.7 g/dL (ref 31.5–35.7)
MCV: 93 fL (ref 79–97)
Monocytes Absolute: 0.4 10*3/uL (ref 0.1–0.9)
Monocytes: 7 %
Neutrophils Absolute: 3.3 10*3/uL (ref 1.4–7.0)
Neutrophils: 60 %
Platelets: 271 10*3/uL (ref 150–450)
RBC: 4.78 x10E6/uL (ref 3.77–5.28)
RDW: 12.6 % (ref 11.7–15.4)
WBC: 5.5 10*3/uL (ref 3.4–10.8)

## 2020-04-08 LAB — TSH: TSH: 1.5 u[IU]/mL (ref 0.450–4.500)

## 2020-04-08 LAB — HEMOGLOBIN A1C
Est. average glucose Bld gHb Est-mCnc: 111 mg/dL
Hgb A1c MFr Bld: 5.5 % (ref 4.8–5.6)

## 2020-04-14 ENCOUNTER — Encounter: Payer: Self-pay | Admitting: Internal Medicine

## 2020-04-14 ENCOUNTER — Other Ambulatory Visit: Payer: Self-pay

## 2020-04-14 ENCOUNTER — Ambulatory Visit (INDEPENDENT_AMBULATORY_CARE_PROVIDER_SITE_OTHER): Payer: Managed Care, Other (non HMO) | Admitting: Internal Medicine

## 2020-04-14 VITALS — BP 112/76 | HR 81 | Temp 98.3°F | Resp 15 | Ht 63.0 in | Wt 145.6 lb

## 2020-04-14 DIAGNOSIS — Z23 Encounter for immunization: Secondary | ICD-10-CM

## 2020-04-14 DIAGNOSIS — Z0000 Encounter for general adult medical examination without abnormal findings: Secondary | ICD-10-CM

## 2020-04-14 DIAGNOSIS — Z124 Encounter for screening for malignant neoplasm of cervix: Secondary | ICD-10-CM

## 2020-04-14 DIAGNOSIS — Z803 Family history of malignant neoplasm of breast: Secondary | ICD-10-CM | POA: Diagnosis not present

## 2020-04-14 DIAGNOSIS — E785 Hyperlipidemia, unspecified: Secondary | ICD-10-CM | POA: Diagnosis not present

## 2020-04-14 NOTE — Patient Instructions (Signed)
YOU RECEIVED THE TDAP VACCINE TODAY.  Your arm may be sore for a few days,  This is expected.  Your 10 yr risk of heart attack is LOW (6%) BASED ON CURRENT LIPID PANEL.  NO MEDICATION IS NEEDED  I ENCOURAGE YOU TO START EXERCISING FOR 30 MINUTES DAILY  Referral for Genetics counselling is in process   Health Maintenance for Postmenopausal Women Menopause is a normal process in which your ability to get pregnant comes to an end. This process happens slowly over many months or years, usually between the ages of 72 and 64. Menopause is complete when you have missed your menstrual periods for 12 months. It is important to talk with your health care provider about some of the most common conditions that affect women after menopause (postmenopausal women). These include heart disease, cancer, and bone loss (osteoporosis). Adopting a healthy lifestyle and getting preventive care can help to promote your health and wellness. The actions you take can also lower your chances of developing some of these common conditions. What should I know about menopause? During menopause, you may get a number of symptoms, such as:  Hot flashes. These can be moderate or severe.  Night sweats.  Decrease in sex drive.  Mood swings.  Headaches.  Tiredness.  Irritability.  Memory problems.  Insomnia. Choosing to treat or not to treat these symptoms is a decision that you make with your health care provider. Do I need hormone replacement therapy?  Hormone replacement therapy is effective in treating symptoms that are caused by menopause, such as hot flashes and night sweats.  Hormone replacement carries certain risks, especially as you become older. If you are thinking about using estrogen or estrogen with progestin, discuss the benefits and risks with your health care provider. What is my risk for heart disease and stroke? The risk of heart disease, heart attack, and stroke increases as you age. One of the  causes may be a change in the body's hormones during menopause. This can affect how your body uses dietary fats, triglycerides, and cholesterol. Heart attack and stroke are medical emergencies. There are many things that you can do to help prevent heart disease and stroke. Watch your blood pressure  High blood pressure causes heart disease and increases the risk of stroke. This is more likely to develop in people who have high blood pressure readings, are of African descent, or are overweight.  Have your blood pressure checked: ? Every 3-5 years if you are 47-64 years of age. ? Every year if you are 6 years old or older. Eat a healthy diet   Eat a diet that includes plenty of vegetables, fruits, low-fat dairy products, and lean protein.  Do not eat a lot of foods that are high in solid fats, added sugars, or sodium. Get regular exercise Get regular exercise. This is one of the most important things you can do for your health. Most adults should:  Try to exercise for at least 150 minutes each week. The exercise should increase your heart rate and make you sweat (moderate-intensity exercise).  Try to do strengthening exercises at least twice each week. Do these in addition to the moderate-intensity exercise.  Spend less time sitting. Even light physical activity can be beneficial. Other tips  Work with your health care provider to achieve or maintain a healthy weight.  Do not use any products that contain nicotine or tobacco, such as cigarettes, e-cigarettes, and chewing tobacco. If you need help quitting, ask your  health care provider.  Know your numbers. Ask your health care provider to check your cholesterol and your blood sugar (glucose). Continue to have your blood tested as directed by your health care provider. Do I need screening for cancer? Depending on your health history and family history, you may need to have cancer screening at different stages of your life. This may  include screening for:  Breast cancer.  Cervical cancer.  Lung cancer.  Colorectal cancer. What is my risk for osteoporosis? After menopause, you may be at increased risk for osteoporosis. Osteoporosis is a condition in which bone destruction happens more quickly than new bone creation. To help prevent osteoporosis or the bone fractures that can happen because of osteoporosis, you may take the following actions:  If you are 64-51 years old, get at least 1,000 mg of calcium and at least 600 mg of vitamin D per day.  If you are older than age 40 but younger than age 58, get at least 1,200 mg of calcium and at least 600 mg of vitamin D per day.  If you are older than age 73, get at least 1,200 mg of calcium and at least 800 mg of vitamin D per day. Smoking and drinking excessive alcohol increase the risk of osteoporosis. Eat foods that are rich in calcium and vitamin D, and do weight-bearing exercises several times each week as directed by your health care provider. How does menopause affect my mental health? Depression may occur at any age, but it is more common as you become older. Common symptoms of depression include:  Low or sad mood.  Changes in sleep patterns.  Changes in appetite or eating patterns.  Feeling an overall lack of motivation or enjoyment of activities that you previously enjoyed.  Frequent crying spells. Talk with your health care provider if you think that you are experiencing depression. General instructions See your health care provider for regular wellness exams and vaccines. This may include:  Scheduling regular health, dental, and eye exams.  Getting and maintaining your vaccines. These include: ? Influenza vaccine. Get this vaccine each year before the flu season begins. ? Pneumonia vaccine. ? Shingles vaccine. ? Tetanus, diphtheria, and pertussis (Tdap) booster vaccine. Your health care provider may also recommend other immunizations. Tell your  health care provider if you have ever been abused or do not feel safe at home. Summary  Menopause is a normal process in which your ability to get pregnant comes to an end.  This condition causes hot flashes, night sweats, decreased interest in sex, mood swings, headaches, or lack of sleep.  Treatment for this condition may include hormone replacement therapy.  Take actions to keep yourself healthy, including exercising regularly, eating a healthy diet, watching your weight, and checking your blood pressure and blood sugar levels.  Get screened for cancer and depression. Make sure that you are up to date with all your vaccines. This information is not intended to replace advice given to you by your health care provider. Make sure you discuss any questions you have with your health care provider. Document Revised: 08/07/2018 Document Reviewed: 08/07/2018 Elsevier Patient Education  2020 [employer].

## 2020-04-14 NOTE — Progress Notes (Signed)
Patient ID: Sylvia Sylvia [name], female    DOB: 1956-05-05  Age: 64 y.o. MRN: [id number]  The patient is here for annual  wellness examination .  This visit occurred during the SARS-CoV-2 public health emergency.  Safety protocols were in place, including screening questions prior to the visit, additional usage of staff PPE, and extensive cleaning of exam room while observing appropriate contact time as indicated for disinfecting solutions.      The risk factors are reflected in the social history.  The roster of all physicians providing medical care to patient - is listed in the Snapshot section of the chart.  Activities of daily living:  The patient is 100% independent in all ADLs: dressing, toileting, feeding as well as independent mobility  Home safety : The patient has smoke detectors in the home. They wear seatbelts.  There are no firearms at home. There is no violence in the home.   There is no risks for hepatitis, STDs or HIV. There is no   history of blood transfusion. They have no travel history to infectious disease endemic areas of the world.  The patient has seen their dentist in the last six month. They have seen their eye doctor in the last year. She denies  hearing difficulty with regard to whispered voices and some television programs.  They have deferred audiologic testing in the last year.  They do not  have excessive sun exposure. Discussed the need for sun protection: hats, long sleeves and use of sunscreen if there is significant sun exposure.   Diet: the importance of a healthy diet is discussed. They do have a healthy diet.  The benefits of regular aerobic exercise were discussed. She walks 4 times per week ,  20 minutes.   Depression screen: there are no signs or vegative symptoms of depression- irritability, change in appetite, anhedonia, sadness/tearfullness.  Cognitive assessment: the patient manages all of Sylvia financial and personal affairs and is actively engaged.  They could relate day,date,year and events; recalled 2/3 objects at 3 minutes; performed clock-face test normally.  The following portions of the patient's history were reviewed and updated as appropriate: allergies, current medications, past family history, past medical history,  past surgical history, past social history  and problem list.  Visual acuity was not assessed per patient preference since she has regular follow up with Sylvia ophthalmologist. Hearing and body mass index were assessed and reviewed.   During the course of the visit the patient was educated and counseled about appropriate screening and preventive services including : fall prevention , diabetes screening, nutrition counseling, colorectal cancer screening, and recommended immunizations.    CC: The primary encounter diagnosis was Cervical cancer screening. Diagnoses of Family history of breast cancer in Sylvia [name], Need for diphtheria-tetanus-pertussis (Tdap) vaccine, Routine general medical examination at a health care facility, Family hx-breast malignancy, and Hyperlipidemia LDL goal <160 were also pertinent to this visit.  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 Patient is up to date on screening;  Discussed the value of genetic testing .  History Sylvia Sylvia [name] has a past medical history of Hyperlipidemia and Vitamin D deficiency.   She has a past surgical history that includes Vaginal delivery; Knee arthroscopy (1983, 2014); Novasure ablation (2010); and Cyst excision (2015).   Sylvia family history includes Breast cancer (age of onset: 75) in Sylvia Sylvia [name]; Cancer (age of onset: 56) in Sylvia father; Hypertension in Sylvia Sylvia [name]; Stroke in Sylvia maternal grandfather.She reports that she  has never smoked. She has never used smokeless tobacco. She reports that she does not drink alcohol and does not use drugs.  Outpatient Medications Prior to Visit  Medication Sig Dispense Refill  . cholecalciferol (VITAMIN D3) 25  MCG (1000 UNIT) tablet Take 1,000 Units by mouth daily.    . ergocalciferol (DRISDOL) 50000 units capsule Take 1 capsule (50,000 Units total) by mouth once a week. 12 capsule 0   No facility-administered medications prior to visit.    Review of Systems   Patient denies headache, fevers, malaise, unintentional weight loss, skin rash, eye pain, sinus congestion and sinus pain, sore throat, dysphagia,  hemoptysis , cough, dyspnea, wheezing, chest pain, palpitations, orthopnea, edema, abdominal pain, nausea, melena, diarrhea, constipation, flank pain, dysuria, hematuria, urinary  Frequency, nocturia, numbness, tingling, seizures,  Focal weakness, Loss of consciousness,  Tremor, insomnia, depression, anxiety, and suicidal ideation.      Objective:  BP 112/76 (BP Location: Left Arm, Patient Position: Sitting, Cuff Size: Normal)   Pulse 81   Temp 98.3 F (36.8 C) (Oral)   Resp 15   Ht 5\' 3"  (1.6 m)   Wt 145 lb 9.6 oz (66 kg)   SpO2 98%   BMI 25.79 kg/m   Physical Exam  General Appearance:    Alert, cooperative, no distress, appears stated age  Head:    Normocephalic, without obvious abnormality, atraumatic  Eyes:    PERRL, conjunctiva/corneas clear, EOM's intact, fundi    benign, both eyes  Ears:    Normal TM's and external ear canals, both ears  Nose:   Nares normal, septum midline, mucosa normal, no drainage    or sinus tenderness  Throat:   Lips, mucosa, and tongue normal; teeth and gums normal  Neck:   Supple, symmetrical, trachea midline, no adenopathy;    thyroid:  no enlargement/tenderness/nodules; no carotid   bruit or JVD  Back:     Symmetric, no curvature, ROM normal, no CVA tenderness  Lungs:     Clear to auscultation bilaterally, respirations unlabored  Chest Wall:    No tenderness or deformity   Heart:    Regular rate and rhythm, S1 and S2 normal, no murmur, rub   or gallop  Breast Exam:    No tenderness, masses, or nipple abnormality  Abdomen:     Soft, non-tender,  bowel sounds active all four quadrants,    no masses, no organomegaly  Genitalia:    Pelvic: cervix normal in appearance, external genitalia normal, no adnexal masses or tenderness, no cervical motion tenderness, rectovaginal septum normal, uterus normal size, shape, and consistency and vagina normal without discharge  Extremities:   Extremities normal, atraumatic, no cyanosis or edema  Pulses:   2+ and symmetric all extremities  Skin:   Skin color, texture, turgor normal, no rashes or lesions  Lymph nodes:   Cervical, supraclavicular, and axillary nodes normal  Neurologic:   CNII-XII intact, normal strength, sensation and reflexes    throughout    Assessment & Plan:   Problem List Items Addressed This Visit      Unprioritized   Family hx-breast malignancy    Given Sylvia Sylvia [name]'s recurrence abd Sylvia history of an ovarian cyst that was precancerous,  I have offered genetic testing . Referral in progress.       Hyperlipidemia LDL goal <160    Untreated lipid profile  reviewed,  Sylvia 10 yr risk  Remains very low at  <6%.    No medications needed  Lab Results  Component Value Date   CHOL 256 (H) 04/07/2020   HDL 73 04/07/2020   LDLCALC 166 (H) 04/07/2020   TRIG 101 04/07/2020   CHOLHDL 3.5 04/07/2020         Routine general medical examination at a health care facility    age appropriate education and counseling updated, referrals for preventative services and immunizations addressed, dietary and smoking counseling addressed, most recent labs reviewed.  I have personally reviewed and have noted:  1) the patient's medical and social history 2) The pt's use of alcohol, tobacco, and illicit drugs 3) The patient's current medications and supplements 4) Functional ability including ADL's, fall risk, home safety risk, hearing and visual impairment 5) Diet and physical activities 6) Evidence for depression or mood disorder 7) The patient's height, weight, and BMI have been recorded in the  chart  I have made referrals, and provided counseling and education based on review of the above       Other Visit Diagnoses    Cervical cancer screening    -  Primary   Relevant Orders   Pap LB (liquid-based)   Family history of breast cancer in Sylvia [name]       Relevant Orders   Ambulatory referral to Genetics   Need for diphtheria-tetanus-pertussis (Tdap) vaccine       Relevant Orders   Tdap vaccine greater than or equal to 7yo IM (Completed)      I have discontinued Sylvia Sylvia [name]'s ergocalciferol. I am also having Sylvia maintain Sylvia cholecalciferol.  No orders of the defined types were placed in this encounter.   Medications Discontinued During This Encounter  Medication Reason  . ergocalciferol (DRISDOL) 50000 units capsule     Follow-up: Return in about 1 year (around 04/14/2021).   Sherlene Shams, MD

## 2020-04-15 ENCOUNTER — Other Ambulatory Visit: Payer: Self-pay | Admitting: Internal Medicine

## 2020-04-15 NOTE — Assessment & Plan Note (Signed)

## 2020-04-15 NOTE — Assessment & Plan Note (Signed)
Untreated lipid profile  reviewed,  Her 10 yr risk  Remains very low at  <6%.    No medications needed  Lab Results  Component Value Date   CHOL 256 (H) 04/07/2020   HDL 73 04/07/2020   LDLCALC 166 (H) 04/07/2020   TRIG 101 04/07/2020   CHOLHDL 3.5 04/07/2020

## 2020-04-15 NOTE — Assessment & Plan Note (Signed)
Given her mother's recurrence abd her history of an ovarian cyst that was precancerous,  I have offered genetic testing . Referral in progress.

## 2020-04-19 LAB — HPV APTIMA: HPV Aptima: NEGATIVE

## 2020-04-19 LAB — PAP LB (LIQUID-BASED)

## 2020-04-26 ENCOUNTER — Telehealth: Payer: Self-pay | Admitting: Internal Medicine

## 2020-04-26 LAB — HM PAP SMEAR: HM Pap smear: NORMAL

## 2020-05-18 ENCOUNTER — Encounter: Payer: Managed Care, Other (non HMO) | Admitting: Licensed Clinical Social Worker

## 2020-05-18 ENCOUNTER — Other Ambulatory Visit: Payer: Managed Care, Other (non HMO)

## 2020-05-18 ENCOUNTER — Telehealth: Payer: Self-pay | Admitting: Internal Medicine

## 2020-05-18 NOTE — Telephone Encounter (Signed)
Rejection Reason - Patient Declined - per patient she does not want the appt at this time" [facility] said on May 18, 2020 1:22 PM

## 2020-07-13 DIAGNOSIS — Z1211 Encounter for screening for malignant neoplasm of colon: Secondary | ICD-10-CM

## 2020-07-22 NOTE — Addendum Note (Signed)
Addended by: Sherlene Shams on: 07/22/2020 04:03 PM   Modules accepted: Orders

## 2021-01-13 ENCOUNTER — Other Ambulatory Visit: Payer: Self-pay | Admitting: Internal Medicine

## 2021-01-13 DIAGNOSIS — Z1231 Encounter for screening mammogram for malignant neoplasm of breast: Secondary | ICD-10-CM

## 2021-03-08 LAB — HM COLONOSCOPY

## 2021-03-28 ENCOUNTER — Ambulatory Visit
Admission: RE | Admit: 2021-03-28 | Discharge: 2021-03-28 | Disposition: A | Discharge status: Home / Self Care | Payer: Managed Care, Other (non HMO) | Source: Ambulatory Visit | Attending: Internal Medicine | Admitting: Internal Medicine

## 2021-03-28 ENCOUNTER — Other Ambulatory Visit: Payer: Self-pay

## 2021-03-28 DIAGNOSIS — Z1231 Encounter for screening mammogram for malignant neoplasm of breast: Secondary | ICD-10-CM | POA: Diagnosis not present

## 2021-04-18 ENCOUNTER — Encounter: Payer: Managed Care, Other (non HMO) | Admitting: Internal Medicine

## 2021-04-18 LAB — HEMOGLOBIN A1C: Hemoglobin A1C: 5.5

## 2021-04-18 LAB — LIPID PANEL
Cholesterol: 226 — AB (ref 0–200)
HDL: 66 (ref 35–70)
LDL Cholesterol: 139
Triglycerides: 117 (ref 40–160)

## 2021-04-18 LAB — BASIC METABOLIC PANEL
Creatinine: 0.7 (ref 0.5–1.1)
Glucose: 95

## 2021-04-19 ENCOUNTER — Encounter: Payer: Self-pay | Admitting: Internal Medicine

## 2021-04-19 ENCOUNTER — Ambulatory Visit (INDEPENDENT_AMBULATORY_CARE_PROVIDER_SITE_OTHER): Payer: Managed Care, Other (non HMO) | Admitting: Internal Medicine

## 2021-04-19 ENCOUNTER — Other Ambulatory Visit: Payer: Self-pay

## 2021-04-19 VITALS — BP 130/78 | HR 73 | Temp 96.5°F | Resp 15 | Ht 63.0 in | Wt 137.8 lb

## 2021-04-19 DIAGNOSIS — E559 Vitamin D deficiency, unspecified: Secondary | ICD-10-CM | POA: Diagnosis not present

## 2021-04-19 DIAGNOSIS — Z0000 Encounter for general adult medical examination without abnormal findings: Secondary | ICD-10-CM | POA: Diagnosis not present

## 2021-04-19 DIAGNOSIS — E785 Hyperlipidemia, unspecified: Secondary | ICD-10-CM

## 2021-04-19 DIAGNOSIS — Z780 Asymptomatic menopausal state: Secondary | ICD-10-CM

## 2021-04-19 DIAGNOSIS — R5383 Other fatigue: Secondary | ICD-10-CM | POA: Diagnosis not present

## 2021-04-19 NOTE — Patient Instructions (Addendum)
Good to see you!  You received the pneumonia vaccine today (good for "life)  I recommend repeating your  DEXA scan to assess your risk for osteoporosis  (you had osteopenia in 2019)

## 2021-04-19 NOTE — Progress Notes (Addendum)
Patient ID: Sylvia [name], female    DOB: September 15, 1955  Age: 65 y.o. MRN: [id number]  The patient is here for annual preventive examination and management of other chronic and acute problems  This visit occurred during the SARS-CoV-2 public health emergency.  Safety protocols were in place, including screening questions prior to the visit, additional usage of staff PPE, and extensive cleaning of exam room while observing appropriate contact time as indicated for disinfecting solutions.  .   The risk factors are reflected in the social history.  The roster of all physicians providing medical care to patient - is listed in the Snapshot section of the chart.  Activities of daily living:  The patient is 100% independent in all ADLs: dressing, toileting, feeding as well as independent mobility  Home safety : The patient has smoke detectors in the home. They wear seatbelts.  There are no firearms at home. There is no violence in the home.   There is no risks for hepatitis, STDs or HIV. There is no   history of blood transfusion. They have no travel history to infectious disease endemic areas of the world.  The patient has seen their dentist in the last six month. They have seen their eye doctor in the last year.  She denies hearing difficulty with regard to whispered voices and some television programs.  They have deferred audiologic testing in the last year.  They do not  have excessive sun exposure. Discussed the need for sun protection: hats, long sleeves and use of sunscreen if there is significant sun exposure.   Diet: the importance of a healthy diet is discussed. She follows a mediterranean diet.  The benefits of regular aerobic exercise were discussed. She does not exercise regularly due to an excessive work schedule : she works from home often from 6 am to 8 pm daily for [employer] .   Depression screen: there are no signs or vegative symptoms of depression- irritability, change in appetite,  anhedonia, sadness/tearfullness.  Cognitive assessment: the patient manages all their financial and personal affairs and is actively engaged. They could relate day,date,year and events; recalled 2/3 objects at 3 minutes; performed clock-face test normally.  The following portions of the patient's history were reviewed and updated as appropriate: allergies, current medications, past family history, past medical history,  past surgical history, past social history  and problem list.  Visual acuity was not assessed per patient preference since she has regular follow up with her ophthalmologist. Hearing and body mass index were assessed and reviewed.   During the course of the visit the patient was educated and counseled about appropriate screening and preventive services including : fall prevention , diabetes screening, nutrition counseling, colorectal cancer screening, and recommended immunizations.    CC: The primary encounter diagnosis was Vitamin D deficiency. Diagnoses of Fatigue, unspecified type, Postmenopausal estrogen deficiency, Routine general medical examination at a health care facility, and Hyperlipidemia LDL goal <160 were also pertinent to this visit.  History Jayanna has a past medical history of Hyperlipidemia and Vitamin D deficiency.   She has a past surgical history that includes Vaginal delivery; Knee arthroscopy (1983, 2014); Novasure ablation (2010); and Cyst excision (2015).   Her family history includes Breast cancer (age of onset: 66) in her mother; Cancer (age of onset: 8) in her father; Hypertension in her mother; Stroke in her maternal grandfather.She reports that she has never smoked. She has never used smokeless tobacco. She reports that she does not drink alcohol and  does not use drugs.  Outpatient Medications Prior to Visit  Medication Sig Dispense Refill   cholecalciferol (VITAMIN D3) 25 MCG (1000 UNIT) tablet Take 1,000 Units by mouth daily.     No  facility-administered medications prior to visit.    Review of Systems  Patient denies headache, fevers, malaise, unintentional weight loss, skin rash, eye pain, sinus congestion and sinus pain, sore throat, dysphagia,  hemoptysis , cough, dyspnea, wheezing, chest pain, palpitations, orthopnea, edema, abdominal pain, nausea, melena, diarrhea, constipation, flank pain, dysuria, hematuria, urinary  Frequency, nocturia, numbness, tingling, seizures,  Focal weakness, Loss of consciousness,  Tremor, insomnia, depression, anxiety, and suicidal ideation.     Objective:  BP 130/78 (BP Location: Left Arm, Patient Position: Sitting, Cuff Size: Normal)   Pulse 73   Temp (!) 96.5 F (35.8 C) (Temporal)   Resp 15   Ht 5\' 3"  (1.6 m)   Wt 137 lb 12.8 oz (62.5 kg)   SpO2 98%   BMI 24.41 kg/m   Physical Exam  General appearance: alert, cooperative and appears stated age Head: Normocephalic, without obvious abnormality, atraumatic Eyes: conjunctivae/corneas clear. PERRL, EOM's intact. Fundi benign. Ears: normal TM's and external ear canals both ears Nose: Nares normal. Septum midline. Mucosa normal. No drainage or sinus tenderness. Throat: lips, mucosa, and tongue normal; teeth and gums normal Neck: no adenopathy, no carotid bruit, no JVD, supple, symmetrical, trachea midline and thyroid not enlarged, symmetric, no tenderness/mass/nodules Lungs: clear to auscultation bilaterally Breasts: deferred by patient  Heart: regular rate and rhythm, S1, S2 normal, no murmur, click, rub or gallop Abdomen: soft, non-tender; bowel sounds normal; no masses,  no organomegaly Extremities: extremities normal, atraumatic, no cyanosis or edema Pulses: 2+ and symmetric Skin: Skin color, texture, turgor normal. No rashes or lesions Neurologic: Alert and oriented X 3, normal strength and tone. Normal symmetric reflexes. Normal coordination and gait.     Assessment & Plan:   Problem List Items Addressed This Visit        Unprioritized   Hyperlipidemia LDL goal <160    10 yr risk using the AHA cardiac RC is 6%   Lab Results  Component Value Date   CHOL 226 (A) 04/18/2021   HDL 66 04/18/2021   LDLCALC 139 04/18/2021   TRIG 117 04/18/2021   CHOLHDL 3.5 04/07/2020         Routine general medical examination at a health care facility    age appropriate education and counseling updated, referrals for preventative services and immunizations addressed, dietary and smoking counseling addressed, most recent labs reviewed.  I have personally reviewed and have noted:   1) the patient's medical and social history 2) The pt's use of alcohol, tobacco, and illicit drugs 3) The patient's current medications and supplements 4) Functional ability including ADL's, fall risk, home safety risk, hearing and visual impairment 5) Diet and physical activities 6) Evidence for depression or mood disorder 7) The patient's height, weight, and BMI have been recorded in the chart   I have made referrals, and provided counseling and education based on review of the above      Vitamin D deficiency - Primary   Relevant Orders   VITAMIN D 25 Hydroxy (Vit-D Deficiency, Fractures)   Other Visit Diagnoses     Fatigue, unspecified type       Relevant Orders   TSH   Comprehensive metabolic panel   Postmenopausal estrogen deficiency       Relevant Orders   DG Bone Density  I am having Sylvia [name] maintain her cholecalciferol.  No orders of the defined types were placed in this encounter.   There are no discontinued medications.  Follow-up: No follow-ups on file.   Sherlene Shams, MD

## 2021-04-21 NOTE — Assessment & Plan Note (Signed)
10 yr risk using the AHA cardiac RC is 6%   Lab Results  Component Value Date   CHOL 226 (A) 04/18/2021   HDL 66 04/18/2021   LDLCALC 139 04/18/2021   TRIG 117 04/18/2021   CHOLHDL 3.5 04/07/2020

## 2021-04-21 NOTE — Assessment & Plan Note (Signed)

## 2021-04-29 LAB — COMPREHENSIVE METABOLIC PANEL
ALT: 15 IU/L (ref 0–32)
AST: 19 IU/L (ref 0–40)
Albumin/Globulin Ratio: 2.5 — ABNORMAL HIGH (ref 1.2–2.2)
Albumin: 4.7 g/dL (ref 3.8–4.8)
Alkaline Phosphatase: 94 IU/L (ref 44–121)
BUN/Creatinine Ratio: 17 (ref 12–28)
BUN: 11 mg/dL (ref 8–27)
Bilirubin Total: 0.3 mg/dL (ref 0.0–1.2)
CO2: 27 mmol/L (ref 20–29)
Calcium: 9.4 mg/dL (ref 8.7–10.3)
Chloride: 103 mmol/L (ref 96–106)
Creatinine, Ser: 0.66 mg/dL (ref 0.57–1.00)
Globulin, Total: 1.9 g/dL (ref 1.5–4.5)
Glucose: 85 mg/dL (ref 65–99)
Potassium: 4.1 mmol/L (ref 3.5–5.2)
Sodium: 143 mmol/L (ref 134–144)
Total Protein: 6.6 g/dL (ref 6.0–8.5)
eGFR: 97 mL/min/{1.73_m2} (ref >59)

## 2021-04-29 LAB — TSH: TSH: 2.26 u[IU]/mL (ref 0.450–4.500)

## 2021-04-29 NOTE — Telephone Encounter (Signed)
Add on request submitted. Waiting on fax

## 2021-05-06 DIAGNOSIS — Z780 Asymptomatic menopausal state: Secondary | ICD-10-CM

## 2021-05-23 LAB — SPECIMEN STATUS REPORT

## 2021-05-23 LAB — VITAMIN D 25 HYDROXY (VIT D DEFICIENCY, FRACTURES): Vit D, 25-Hydroxy: 37.1 ng/mL (ref 30.0–100.0)

## 2021-06-08 ENCOUNTER — Ambulatory Visit
Admission: RE | Admit: 2021-06-08 | Discharge: 2021-06-08 | Disposition: A | Discharge status: Home / Self Care | Payer: Managed Care, Other (non HMO) | Source: Ambulatory Visit | Attending: Internal Medicine | Admitting: Internal Medicine

## 2021-06-08 ENCOUNTER — Other Ambulatory Visit: Payer: Self-pay

## 2021-06-08 DIAGNOSIS — Z780 Asymptomatic menopausal state: Secondary | ICD-10-CM | POA: Insufficient documentation

## 2021-06-10 ENCOUNTER — Encounter: Payer: Self-pay | Admitting: Internal Medicine

## 2021-06-10 DIAGNOSIS — Z780 Asymptomatic menopausal state: Secondary | ICD-10-CM | POA: Insufficient documentation

## 2021-06-10 DIAGNOSIS — M8580 Other specified disorders of bone density and structure, unspecified site: Secondary | ICD-10-CM | POA: Insufficient documentation

## 2021-10-19 ENCOUNTER — Other Ambulatory Visit: Payer: Managed Care, Other (non HMO)

## 2022-04-13 LAB — HEMOGLOBIN A1C: Hemoglobin A1C: 5.5

## 2022-04-13 LAB — COMPREHENSIVE METABOLIC PANEL: eGFR: 85

## 2022-04-13 LAB — BASIC METABOLIC PANEL
Creatinine: 0.8 (ref 0.5–1.1)
Glucose: 90

## 2022-04-13 LAB — LIPID PANEL
Cholesterol: 265 — AB (ref 0–200)
HDL: 70 (ref 35–70)
LDL Cholesterol: 170
Triglycerides: 142 (ref 40–160)

## 2022-04-20 ENCOUNTER — Ambulatory Visit (INDEPENDENT_AMBULATORY_CARE_PROVIDER_SITE_OTHER): Payer: Managed Care, Other (non HMO) | Admitting: Internal Medicine

## 2022-04-20 ENCOUNTER — Encounter: Payer: Self-pay | Admitting: Internal Medicine

## 2022-04-20 VITALS — BP 118/82 | HR 85 | Temp 97.4°F | Ht 63.0 in | Wt 145.4 lb

## 2022-04-20 DIAGNOSIS — R7301 Impaired fasting glucose: Secondary | ICD-10-CM

## 2022-04-20 DIAGNOSIS — Z1231 Encounter for screening mammogram for malignant neoplasm of breast: Secondary | ICD-10-CM | POA: Diagnosis not present

## 2022-04-20 DIAGNOSIS — R5383 Other fatigue: Secondary | ICD-10-CM | POA: Diagnosis not present

## 2022-04-20 DIAGNOSIS — Z0000 Encounter for general adult medical examination without abnormal findings: Secondary | ICD-10-CM | POA: Diagnosis not present

## 2022-04-20 DIAGNOSIS — E785 Hyperlipidemia, unspecified: Secondary | ICD-10-CM | POA: Diagnosis not present

## 2022-04-20 NOTE — Progress Notes (Signed)
The patient is here for annual preventive examination and management of other chronic and acute problems.   The risk factors are reflected in the social history.   The roster of all physicians providing medical care to patient - is listed in the Snapshot section of the chart.   Activities of daily living:  The patient is 100% independent in all ADLs: dressing, toileting, feeding as well as independent mobility   Home safety : The patient has smoke detectors in the home. They wear seatbelts.  There are no unsecured firearms at home. There is no violence in the home.    There is no risks for hepatitis, STDs or HIV. There is no   history of blood transfusion. They have no travel history to infectious disease endemic areas of the world.   The patient has seen their dentist in the last six month. They have seen their eye doctor in the last year. The patinet  denies slight hearing difficulty with regard to whispered voices and some television programs.  They have deferred audiologic testing in the last year.  They do not  have excessive sun exposure. Discussed the need for sun protection: hats, long sleeves and use of sunscreen if there is significant sun exposure.    Diet: the importance of a healthy diet is discussed. They do have a healthy diet.   The benefits of regular aerobic exercise were discussed. The patient  does not exercise,  because she works 15 hours a day , 6 days per week for [employer]. .    Depression screen: there are no signs or vegative symptoms of depression- irritability, change in appetite, anhedonia, sadness/tearfullness.   The following portions of the patient's history were reviewed and updated as appropriate: allergies, current medications, past family history, past medical history,  past surgical history, past social history  and problem list.   Visual acuity was not assessed per patient preference since the patient has regular follow up with an  ophthalmologist. Hearing  and body mass index were assessed and reviewed.    During the course of the visit the patient was educated and counseled about appropriate screening and preventive services including : fall prevention , diabetes screening, nutrition counseling, colorectal cancer screening, and recommended immunizations.    Chief Complaint:  none     Review of Symptoms  Patient denies headache, fevers, malaise, unintentional weight loss, skin rash, eye pain, sinus congestion and sinus pain, sore throat, dysphagia,  hemoptysis , cough, dyspnea, wheezing, chest pain, palpitations, orthopnea, edema, abdominal pain, nausea, melena, diarrhea, constipation, flank pain, dysuria, hematuria, urinary  Frequency, nocturia, numbness, tingling, seizures,  Focal weakness, Loss of consciousness,  Tremor, insomnia, depression, anxiety, and suicidal ideation.    Physical Exam:  BP 118/82 (BP Location: Left Arm, Patient Position: Sitting, Cuff Size: Normal)   Pulse 85   Temp (!) 97.4 F (36.3 C) (Oral)   Ht 5\' 3"  (1.6 m)   Wt 145 lb 6.4 oz (66 kg)   SpO2 98%   BMI 25.76 kg/m    General appearance: alert, cooperative and appears stated age Head: Normocephalic, without obvious abnormality, atraumatic Eyes: conjunctivae/corneas clear. PERRL, EOM's intact. Fundi benign. Ears: normal TM's and external ear canals both ears Nose: Nares normal. Septum midline. Mucosa normal. No drainage or sinus tenderness. Throat: lips, mucosa, and tongue normal; teeth and gums normal Neck: no adenopathy, no carotid bruit, no JVD, supple, symmetrical, trachea midline and thyroid not enlarged, symmetric, no tenderness/mass/nodules Lungs: clear to auscultation bilaterally Breasts:  normal appearance, no masses or tenderness Heart: regular rate and rhythm, S1, S2 normal, no murmur, click, rub or gallop Abdomen: soft, non-tender; bowel sounds normal; no masses,  no organomegaly Extremities: extremities normal, atraumatic, no cyanosis or  edema Pulses: 2+ and symmetric Skin: Skin color, texture, turgor normal. No rashes or lesions Neurologic: Alert and oriented X 3, normal strength and tone. Normal symmetric reflexes. Normal coordination and gait.      Assessment and Plan:  Routine general medical examination at a health care facility age appropriate education and counseling updated, referrals for preventative services and immunizations addressed, dietary and smoking counseling addressed, most recent labs reviewed.  I have personally reviewed and have noted:   1) the patient's medical and social history 2) The pt's use of alcohol, tobacco, and illicit drugs 3) The patient's current medications and supplements 4) Functional ability including ADL's, fall risk, home safety risk, hearing and visual impairment 5) Diet and physical activities 6) Evidence for depression or mood disorder 7) The patient's height, weight, and BMI have been recorded in the chart  I have made referrals, and provided counseling and education based on review of the above  Hyperlipidemia LDL goal <160 Her ten year risk using the AHA calculator is 7%.  No therapy is indicated.   Updated Medication List Outpatient Encounter Medications as of 04/20/2022  Medication Sig   cholecalciferol (VITAMIN D3) 25 MCG (1000 UNIT) tablet Take 1,000 Units by mouth daily.   No facility-administered encounter medications on file as of 04/20/2022.

## 2022-04-20 NOTE — Assessment & Plan Note (Signed)
Her ten year risk using the AHA calculator is 7%.  No therapy is indicated.

## 2022-04-20 NOTE — Assessment & Plan Note (Signed)

## 2022-04-20 NOTE — Patient Instructions (Signed)
Your 10 yr risk of CAD using the AHA risk calculator is <  6%.  There is no indication for statin therapy.    However, if you want to lower your LDL,   consider a trial of red Yeast Rice as a natural remedy, along with a low glycemic index diet,  , the dose is 600 mg twice daily in capsule form, available OTC.  It does require monitoring of liver enzymes,  Just like the statins,  So if you decide to start it,  I would like you to have a liver panel  in 3 months  along with a repeat lipid panel

## 2022-05-10 ENCOUNTER — Ambulatory Visit
Admission: RE | Admit: 2022-05-10 | Discharge: 2022-05-10 | Disposition: A | Discharge status: Home / Self Care | Payer: Managed Care, Other (non HMO) | Source: Ambulatory Visit | Attending: Internal Medicine | Admitting: Internal Medicine

## 2022-05-10 DIAGNOSIS — Z1231 Encounter for screening mammogram for malignant neoplasm of breast: Secondary | ICD-10-CM | POA: Diagnosis present

## 2022-05-12 LAB — COMPREHENSIVE METABOLIC PANEL
ALT: 17 IU/L (ref 0–32)
AST: 21 IU/L (ref 0–40)
Albumin/Globulin Ratio: 1.8 (ref 1.2–2.2)
Albumin: 4.6 g/dL (ref 3.9–4.9)
Alkaline Phosphatase: 92 IU/L (ref 44–121)
BUN/Creatinine Ratio: 17 (ref 12–28)
BUN: 13 mg/dL (ref 8–27)
Bilirubin Total: 0.4 mg/dL (ref 0.0–1.2)
CO2: 24 mmol/L (ref 20–29)
Calcium: 9.8 mg/dL (ref 8.7–10.3)
Chloride: 101 mmol/L (ref 96–106)
Creatinine, Ser: 0.76 mg/dL (ref 0.57–1.00)
Globulin, Total: 2.5 g/dL (ref 1.5–4.5)
Glucose: 86 mg/dL (ref 70–99)
Potassium: 4.6 mmol/L (ref 3.5–5.2)
Sodium: 142 mmol/L (ref 134–144)
Total Protein: 7.1 g/dL (ref 6.0–8.5)
eGFR: 86 mL/min/{1.73_m2} (ref >59)

## 2022-05-12 LAB — CBC WITH DIFFERENTIAL/PLATELET
Basophils Absolute: 0.1 10*3/uL (ref 0.0–0.2)
Basos: 1 %
EOS (ABSOLUTE): 0.1 10*3/uL (ref 0.0–0.4)
Eos: 1 %
Hematocrit: 41 % (ref 34.0–46.6)
Hemoglobin: 13.6 g/dL (ref 11.1–15.9)
Immature Grans (Abs): 0 10*3/uL (ref 0.0–0.1)
Immature Granulocytes: 0 %
Lymphocytes Absolute: 2.1 10*3/uL (ref 0.7–3.1)
Lymphs: 37 %
MCH: 30.9 pg (ref 26.6–33.0)
MCHC: 33.2 g/dL (ref 31.5–35.7)
MCV: 93 fL (ref 79–97)
Monocytes Absolute: 0.4 10*3/uL (ref 0.1–0.9)
Monocytes: 7 %
Neutrophils Absolute: 3.2 10*3/uL (ref 1.4–7.0)
Neutrophils: 54 %
Platelets: 281 10*3/uL (ref 150–450)
RBC: 4.4 x10E6/uL (ref 3.77–5.28)
RDW: 12.3 % (ref 11.7–15.4)
WBC: 5.8 10*3/uL (ref 3.4–10.8)

## 2022-05-12 LAB — TSH: TSH: 1.85 u[IU]/mL (ref 0.450–4.500)

## 2022-08-09 NOTE — Telephone Encounter (Signed)
MyChart messgae sent to patient. 

## 2023-01-17 IMAGING — MG MM DIGITAL SCREENING BILAT W/ TOMO AND CAD
8 series · 9 of 24 positions shown · non-contrast
Comparison: Previous exam(s).

CLINICAL DATA: Screening.

EXAM:
DIGITAL SCREENING BILATERAL MAMMOGRAM WITH TOMOSYNTHESIS AND CAD
TECHNIQUE: Bilateral screening digital craniocaudal and mediolateral oblique
mammograms were obtained. Bilateral screening digital breast
tomosynthesis was performed. The images were evaluated with
computer-aided detection.

[R CC synth-2D]
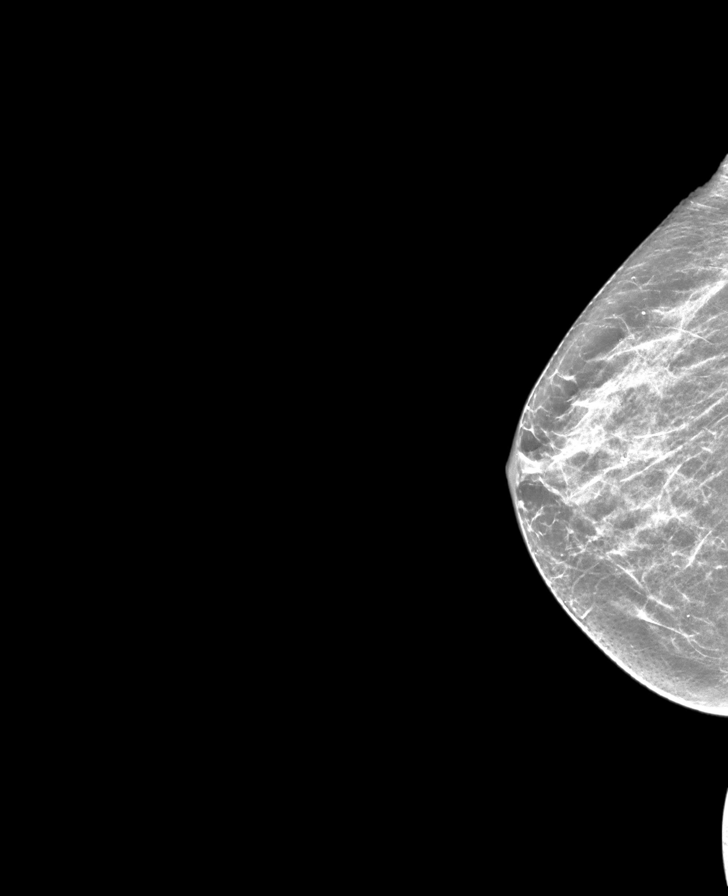

[L CC synth-2D]
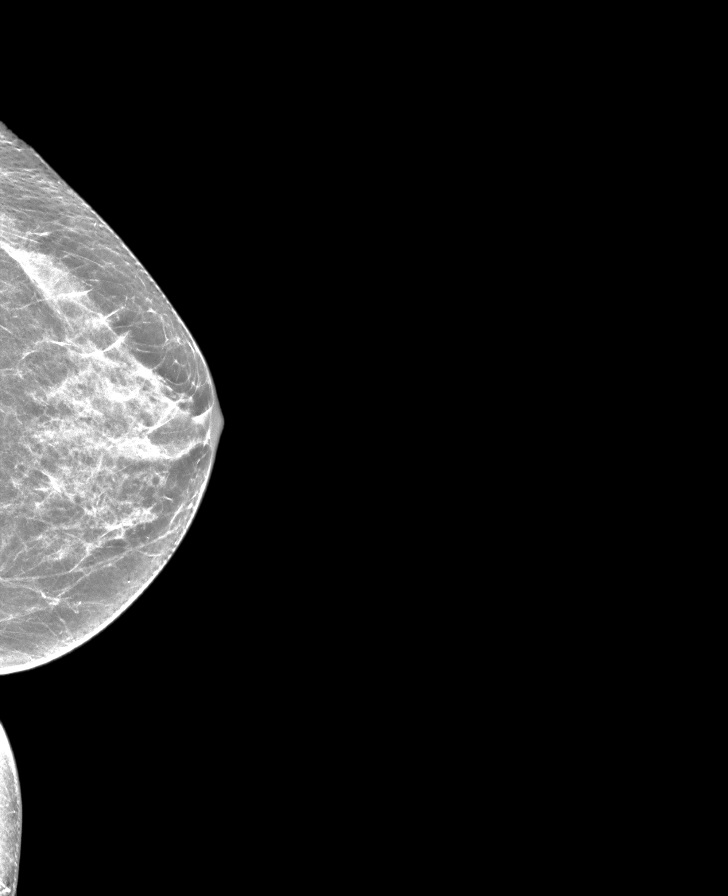

[R MLO synth-2D]
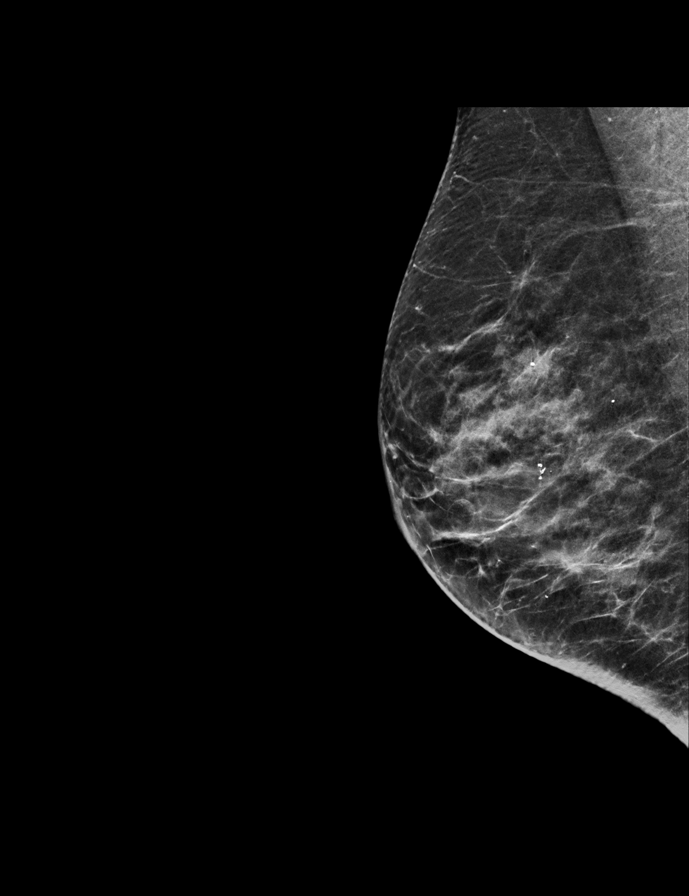

[L MLO synth-2D]
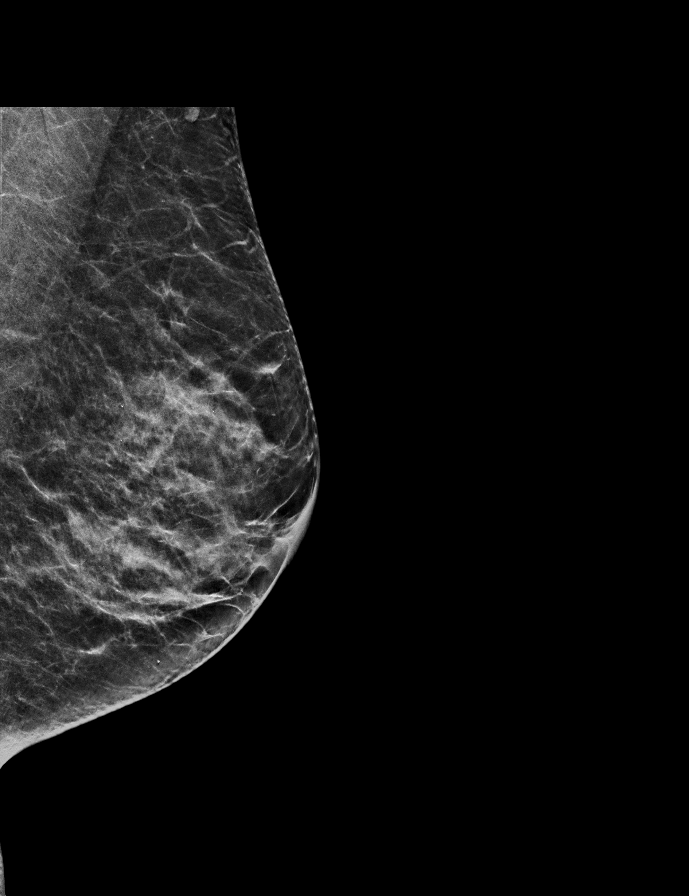

[R MLO tomo · 2 of 57 frames shown]
[frame 19/57]
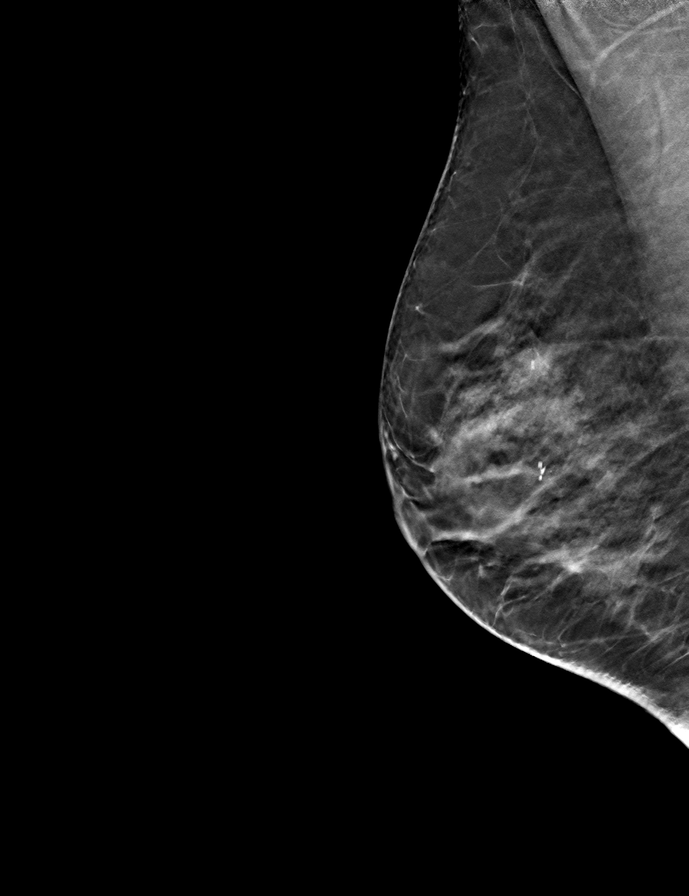
[frame 29/57]
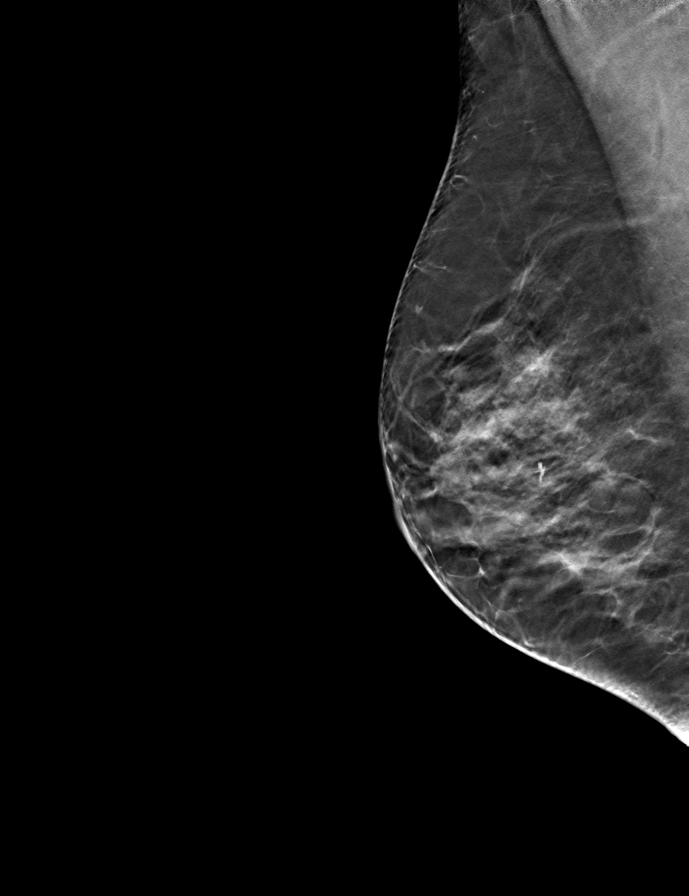

[L CC tomo · tomo slice 27/54.0]
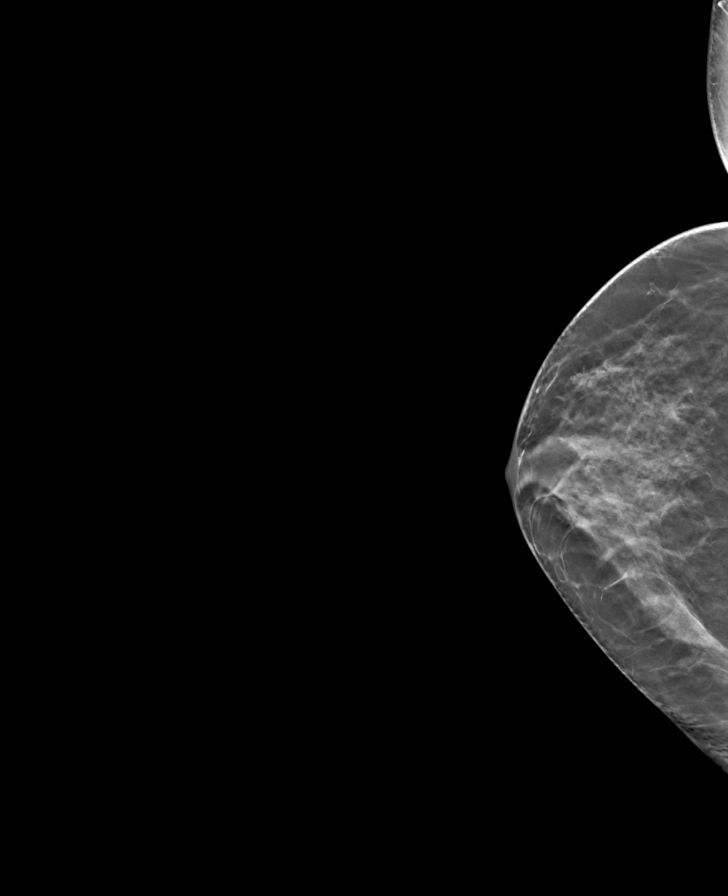

[R CC tomo · tomo slice 29/57.0]
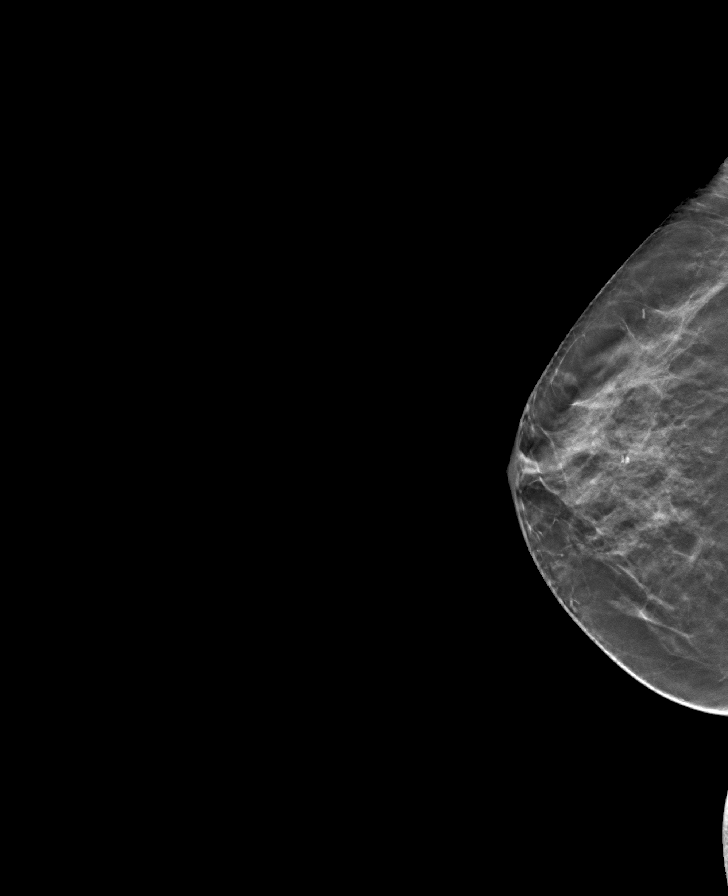

[L MLO tomo · tomo slice 27/53.0]
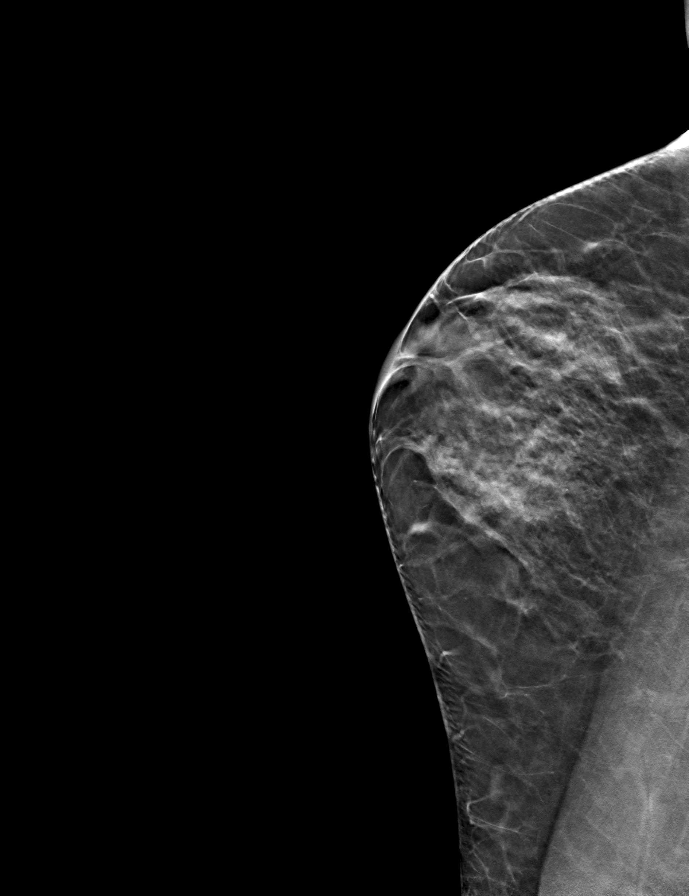

[9 of 24 positions shown; findings below may reference images not displayed]

ACR Breast Density Category c: The breast tissue is heterogeneously
dense, which may obscure small masses.
FINDINGS: There are no findings suspicious for malignancy.
IMPRESSION: No mammographic evidence of malignancy. A result letter of this
screening mammogram will be mailed directly to the patient.

RECOMMENDATION:
Screening mammogram in one year. (Code:Q3-W-BC3)

BI-RADS CATEGORY  1: Negative.

## 2023-03-15 ENCOUNTER — Other Ambulatory Visit: Payer: Self-pay | Admitting: Internal Medicine

## 2023-03-15 DIAGNOSIS — Z1231 Encounter for screening mammogram for malignant neoplasm of breast: Secondary | ICD-10-CM

## 2023-04-23 ENCOUNTER — Encounter: Payer: Managed Care, Other (non HMO) | Admitting: Internal Medicine

## 2023-04-26 ENCOUNTER — Other Ambulatory Visit: Payer: Self-pay | Admitting: Internal Medicine

## 2023-04-26 DIAGNOSIS — Z0000 Encounter for general adult medical examination without abnormal findings: Secondary | ICD-10-CM

## 2023-04-26 DIAGNOSIS — E782 Mixed hyperlipidemia: Secondary | ICD-10-CM

## 2023-05-08 ENCOUNTER — Ambulatory Visit
Admission: RE | Admit: 2023-05-08 | Discharge: 2023-05-08 | Disposition: A | Discharge status: Home / Self Care | Payer: Managed Care, Other (non HMO) | Source: Ambulatory Visit | Attending: Internal Medicine | Admitting: Internal Medicine

## 2023-05-08 DIAGNOSIS — E782 Mixed hyperlipidemia: Secondary | ICD-10-CM | POA: Insufficient documentation

## 2023-05-08 DIAGNOSIS — Z0000 Encounter for general adult medical examination without abnormal findings: Secondary | ICD-10-CM | POA: Insufficient documentation

## 2023-05-14 ENCOUNTER — Ambulatory Visit
Admission: RE | Admit: 2023-05-14 | Discharge: 2023-05-14 | Disposition: A | Discharge status: Home / Self Care | Payer: Managed Care, Other (non HMO) | Source: Ambulatory Visit | Attending: Internal Medicine | Admitting: Internal Medicine

## 2023-05-14 DIAGNOSIS — Z1231 Encounter for screening mammogram for malignant neoplasm of breast: Secondary | ICD-10-CM | POA: Diagnosis present

## 2024-05-02 ENCOUNTER — Other Ambulatory Visit: Payer: Self-pay | Admitting: Internal Medicine

## 2024-05-02 DIAGNOSIS — Z1231 Encounter for screening mammogram for malignant neoplasm of breast: Secondary | ICD-10-CM

## 2024-05-13 ENCOUNTER — Other Ambulatory Visit: Payer: Self-pay | Admitting: Internal Medicine

## 2024-05-13 DIAGNOSIS — M8580 Other specified disorders of bone density and structure, unspecified site: Secondary | ICD-10-CM

## 2024-06-12 ENCOUNTER — Ambulatory Visit
Admission: RE | Admit: 2024-06-12 | Discharge: 2024-06-12 | Disposition: A | Discharge status: Home / Self Care | Source: Ambulatory Visit | Attending: Internal Medicine | Admitting: Internal Medicine

## 2024-06-12 DIAGNOSIS — Z1231 Encounter for screening mammogram for malignant neoplasm of breast: Secondary | ICD-10-CM

## 2024-06-12 DIAGNOSIS — M8580 Other specified disorders of bone density and structure, unspecified site: Secondary | ICD-10-CM | POA: Diagnosis present
